# Patient Record
Sex: Male | Born: 1990 | Race: Black or African American | Hispanic: No | Marital: Single | Smoking: Former smoker
Health system: Southern US, Community
[De-identification: ages and names within clinical notes are randomized; demographics above are authoritative.]

## PROBLEM LIST (undated history)

## (undated) DIAGNOSIS — B009 Herpesviral infection, unspecified: Secondary | ICD-10-CM

## (undated) DIAGNOSIS — D1803 Hemangioma of intra-abdominal structures: Secondary | ICD-10-CM

## (undated) DIAGNOSIS — K2970 Gastritis, unspecified, without bleeding: Secondary | ICD-10-CM

## (undated) HISTORY — PX: NO PAST SURGERIES: SHX2092

---

## 2012-02-24 ENCOUNTER — Encounter (HOSPITAL_COMMUNITY): Payer: Self-pay | Admitting: Emergency Medicine

## 2012-02-24 DIAGNOSIS — Y929 Unspecified place or not applicable: Secondary | ICD-10-CM | POA: Insufficient documentation

## 2012-02-24 DIAGNOSIS — Y939 Activity, unspecified: Secondary | ICD-10-CM | POA: Insufficient documentation

## 2012-02-24 DIAGNOSIS — F17200 Nicotine dependence, unspecified, uncomplicated: Secondary | ICD-10-CM | POA: Insufficient documentation

## 2012-02-24 DIAGNOSIS — L738 Other specified follicular disorders: Secondary | ICD-10-CM | POA: Insufficient documentation

## 2012-02-24 NOTE — ED Notes (Signed)
PT. REPORTS INSECT BITE AT RIGHT LATERAL UPPER THIGH LAST Sunday WITH DRAINAGE .

## 2012-02-25 ENCOUNTER — Emergency Department (HOSPITAL_COMMUNITY)
Admission: EM | Admit: 2012-02-25 | Discharge: 2012-02-25 | Disposition: A | Discharge status: Home / Self Care | Payer: BC Managed Care – PPO | Attending: Emergency Medicine | Admitting: Emergency Medicine

## 2012-02-25 DIAGNOSIS — L731 Pseudofolliculitis barbae: Secondary | ICD-10-CM

## 2012-02-25 MED ORDER — MUPIROCIN 2 % EX OINT
TOPICAL_OINTMENT | Freq: Two times a day (BID) | CUTANEOUS | Status: DC
  Administered 2012-02-25: 02:00:00 via TOPICAL
  Filled 2012-02-25: qty 22

## 2012-02-25 NOTE — ED Provider Notes (Signed)
History     CSN: [id number]  Arrival date & time 02/24/12  2333   None     Chief Complaint  Patient presents with  . Insect Bite    (Consider location/radiation/quality/duration/timing/severity/associated sxs/prior treatment) HPI Comments: Patient noted an itchy are on lateral thigh 4 days ago now scratched and sore   The history is provided by the patient.    History reviewed. No pertinent past medical history.  History reviewed. No pertinent past surgical history.  No family history on file.  History  Substance Use Topics  . Smoking status: Current Every Day Smoker  . Smokeless tobacco: Not on file  . Alcohol Use: Yes      Review of Systems  Constitutional: Negative for fever.  Cardiovascular: Negative.   Gastrointestinal: Negative.   Genitourinary: Negative.   Musculoskeletal: Negative for joint swelling.  Skin: Negative for wound.  Neurological: Negative for weakness.    Allergies  Review of patient's allergies indicates no known allergies.  Home Medications  No current outpatient prescriptions on file.  BP 137/74  Pulse 67  Temp 98.2 F (36.8 C) (Oral)  Resp 14  SpO2 99%  Physical Exam  Constitutional: He appears well-developed.  Eyes: Pupils are equal, round, and reactive to light.  Cardiovascular: Normal rate.   Musculoskeletal: He exhibits tenderness. He exhibits no edema.       Several ingrown hairs lateral R thigh   Neurological: He is alert.  Skin: Skin is warm. There is erythema.       Small amount of erythema noted ? Due to scratching or inflammatory process    ED Course  Procedures (including critical care time)  Labs Reviewed - No data to display No results found.   1. Ingrown hair       MDM  Will treat with topical Bactroban         Arman Filter, NP 02/25/12 0151

## 2012-02-25 NOTE — ED Notes (Signed)
Patient presents with what he states are two bug bites to his right upper thigh.  States he has had some drainage but no drainage now.

## 2012-02-25 NOTE — ED Notes (Signed)
Ointment given for patient to take home and apply

## 2012-03-06 NOTE — ED Provider Notes (Signed)
Medical screening examination/treatment/procedure(s) were performed by non-physician practitioner and as supervising physician I was immediately available for consultation/collaboration.   Suzi Roots, MD 03/06/12 [phone]

## 2012-12-10 ENCOUNTER — Encounter (HOSPITAL_COMMUNITY): Payer: Self-pay | Admitting: Emergency Medicine

## 2012-12-10 ENCOUNTER — Emergency Department (INDEPENDENT_AMBULATORY_CARE_PROVIDER_SITE_OTHER)
Admission: EM | Admit: 2012-12-10 | Discharge: 2012-12-10 | Disposition: A | Discharge status: Home / Self Care | Payer: BC Managed Care – PPO | Source: Home / Self Care

## 2012-12-10 DIAGNOSIS — M542 Cervicalgia: Secondary | ICD-10-CM

## 2012-12-10 DIAGNOSIS — S139XXA Sprain of joints and ligaments of unspecified parts of neck, initial encounter: Secondary | ICD-10-CM

## 2012-12-10 DIAGNOSIS — S0093XA Contusion of unspecified part of head, initial encounter: Secondary | ICD-10-CM

## 2012-12-10 DIAGNOSIS — T148XXA Other injury of unspecified body region, initial encounter: Secondary | ICD-10-CM

## 2012-12-10 DIAGNOSIS — S161XXA Strain of muscle, fascia and tendon at neck level, initial encounter: Secondary | ICD-10-CM

## 2012-12-10 DIAGNOSIS — M549 Dorsalgia, unspecified: Secondary | ICD-10-CM

## 2012-12-10 DIAGNOSIS — S0003XA Contusion of scalp, initial encounter: Secondary | ICD-10-CM

## 2012-12-10 MED ORDER — CYCLOBENZAPRINE HCL 5 MG PO TABS: ORAL_TABLET | ORAL | Status: DC

## 2012-12-10 MED ORDER — TRAMADOL HCL 50 MG PO TABS
50.0000 mg | ORAL_TABLET | Freq: Four times a day (QID) | ORAL | Status: DC | PRN
Start: 2012-12-10 — End: 2014-01-21

## 2012-12-10 MED ORDER — DICLOFENAC POTASSIUM 50 MG PO TABS: 50.0000 mg | ORAL_TABLET | Freq: Three times a day (TID) | ORAL | Status: DC
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
## 2012-12-11 NOTE — ED Provider Notes (Signed)
Medical screening examination/treatment/procedure(s) were performed by a resident physician or non-physician practitioner and as the supervising physician I was immediately available for consultation/collaboration.  Clementeen Graham, MD   Rodolph Bong, MD 12/11/12 [phone]

## 2013-06-09 DIAGNOSIS — B009 Herpesviral infection, unspecified: Secondary | ICD-10-CM

## 2013-06-09 HISTORY — DX: Herpesviral infection, unspecified: B00.9

## 2013-06-23 ENCOUNTER — Emergency Department (HOSPITAL_COMMUNITY)
Admission: EM | Admit: 2013-06-23 | Discharge: 2013-06-23 | Disposition: A | Discharge status: Home / Self Care | Payer: BC Managed Care – PPO | Source: Home / Self Care | Attending: Emergency Medicine | Admitting: Emergency Medicine

## 2013-06-23 ENCOUNTER — Encounter (HOSPITAL_COMMUNITY): Payer: Self-pay | Admitting: Emergency Medicine

## 2013-06-23 ENCOUNTER — Other Ambulatory Visit (HOSPITAL_COMMUNITY)
Admission: RE | Admit: 2013-06-23 | Discharge: 2013-06-23 | Disposition: A | Discharge status: Home / Self Care | Payer: BC Managed Care – PPO | Source: Ambulatory Visit | Attending: Emergency Medicine | Admitting: Emergency Medicine

## 2013-06-23 DIAGNOSIS — Z113 Encounter for screening for infections with a predominantly sexual mode of transmission: Secondary | ICD-10-CM | POA: Insufficient documentation

## 2013-06-23 DIAGNOSIS — N342 Other urethritis: Secondary | ICD-10-CM

## 2013-06-23 LAB — POCT URINALYSIS DIP (DEVICE)
BILIRUBIN URINE: NEGATIVE
Glucose, UA: NEGATIVE mg/dL
KETONES UR: NEGATIVE mg/dL
LEUKOCYTES UA: NEGATIVE
Nitrite: NEGATIVE
PH: 7 (ref 5.0–8.0)
Protein, ur: 30 mg/dL — AB
SPECIFIC GRAVITY, URINE: 1.025 (ref 1.005–1.030)
Urobilinogen, UA: 1 mg/dL (ref 0.0–1.0)

## 2013-06-23 MED ORDER — AZITHROMYCIN 250 MG PO TABS
ORAL_TABLET | ORAL | Status: AC
  Filled 2013-06-23: qty 4

## 2013-06-23 MED ORDER — CEFTRIAXONE SODIUM 250 MG IJ SOLR
INTRAMUSCULAR | Status: AC
  Filled 2013-06-23: qty 250

## 2013-06-23 MED ORDER — CEFTRIAXONE SODIUM 250 MG IJ SOLR
250.0000 mg | Freq: Once | INTRAMUSCULAR | Status: AC
  Administered 2013-06-23: 250 mg via INTRAMUSCULAR

## 2013-06-23 MED ORDER — LIDOCAINE HCL (PF) 1 % IJ SOLN
INTRAMUSCULAR | Status: AC
  Filled 2013-06-23: qty 5

## 2013-06-23 MED ORDER — AZITHROMYCIN 250 MG PO TABS
1000.0000 mg | ORAL_TABLET | Freq: Once | ORAL | Status: AC
  Administered 2013-06-23: 1000 mg via ORAL

## 2013-06-23 NOTE — ED Notes (Signed)
C/o pain w urination since Sunday, Monday. Denies d/c ,denies unprotected sex. States he has not urinated since this AM  (only a few drops)

## 2013-06-23 NOTE — ED Provider Notes (Signed)
CSN: [id number]     Arrival date & time 06/23/13  1003 History   First MD Initiated Contact with Patient 06/23/13 1042     Chief Complaint  Patient presents with  . Dysuria   (Consider location/radiation/quality/duration/timing/severity/associated sxs/prior Treatment) HPI Comments: Denies genital lesions, hematuria or flank pain. Denies urgency, fever or penile discharge. 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.  Patient is a 23 y.o. male presenting with dysuria. The history is provided by the patient.  Dysuria This is a new problem. Episode onset: x 4 days. The problem has not changed since onset.   History reviewed. No pertinent past medical history. History reviewed. No pertinent past surgical history. History reviewed. No pertinent family history. History  Substance Use Topics  . Smoking status: Current Every Day Smoker  . Smokeless tobacco: Not on file  . Alcohol Use: Yes    Review of Systems  Genitourinary: Positive for dysuria.  All other systems reviewed and are negative.   Allergies  Review of patient's allergies indicates no known allergies.  Home Medications   Prior to Admission medications   Medication Sig Start Date End Date Taking? Authorizing Provider  cyclobenzaprine (FLEXERIL) 5 MG tablet Take 1 tablet before bedtime if needed for muscle pain. 12/10/12   Janne Napoleon, NP  diclofenac (CATAFLAM) 50 MG tablet Take 1 tablet (50 mg total) by mouth 3 (three) times daily. 12/10/12   Janne Napoleon, NP  traMADol (ULTRAM) 50 MG tablet Take 1 tablet (50 mg total) by mouth every 6 (six) hours as needed for pain. 12/10/12   Janne Napoleon, NP   BP 131/84  Pulse 77  Temp(Src) 98.4 F (36.9 C) (Oral)  Resp 10  SpO2 100% Physical Exam  Nursing note and vitals reviewed. Constitutional: He is oriented to person, place, and time. He appears well-developed and well-nourished.  HENT:  Head: Normocephalic and atraumatic.  Eyes: Conjunctivae are normal. No scleral icterus.   Cardiovascular: Normal rate, regular rhythm and normal heart sounds.   Pulmonary/Chest: Effort normal and breath sounds normal. No respiratory distress. He has no wheezes.  Abdominal: Soft. Bowel sounds are normal. He exhibits no distension. There is no tenderness. There is no CVA tenderness.  Genitourinary:  Patient declined examination of genitalia.   Musculoskeletal: Normal range of motion.  Neurological: He is alert and oriented to person, place, and time.  Skin: Skin is warm and dry. No rash noted.  Psychiatric: He has a normal mood and affect. His behavior is normal.    ED Course  Procedures (including critical care time) Labs Review Labs Reviewed  POCT URINALYSIS DIP (DEVICE) - Abnormal; Notable for the following:    Hgb urine dipstick SMALL (*)    Protein, ur 30 (*)    All other components within normal limits  URINE CYTOLOGY ANCILLARY ONLY    Results for orders placed during the hospital encounter of 06/23/13  POCT URINALYSIS DIP (DEVICE)      Result Value Ref Range   Glucose, UA NEGATIVE  NEGATIVE mg/dL   Bilirubin Urine NEGATIVE  NEGATIVE   Ketones, ur NEGATIVE  NEGATIVE mg/dL   Specific Gravity, Urine 1.025  1.005 - 1.030   Hgb urine dipstick SMALL (*) NEGATIVE   pH 7.0  5.0 - 8.0   Protein, ur 30 (*) NEGATIVE mg/dL   Urobilinogen, UA 1.0  0.0 - 1.0 mg/dL   Nitrite NEGATIVE  NEGATIVE   Leukocytes, UA NEGATIVE  NEGATIVE   Imaging Review No results found.  MDM   1. Urethritis    UA: only notable for trace heme. Discussed with patient the possibility of underlying STD causing his symptoms and that we could test for GC, chlamydia and trichomonas. Also offered him the option to be treated today for [facility] and chlamydia or to wait for lab results. He opted for empiric treatment.   [place], [place] 06/23/13 1125

## 2013-06-23 NOTE — Discharge Instructions (Signed)
Urethritis, Adult  Urethritis is an inflammation of the tube through which urine exits your bladder (urethra).   CAUSES  Urethritis is often caused by an infection in your urethra. The infection can be viral, like herpes. The infection can also be bacterial, like gonorrhea.  RISK FACTORS  Risk factors of urethritis include:  · Having sex without using a condom.  · Having multiple sexual partners.  · Having poor hygiene.  SIGNS AND SYMPTOMS  Symptoms of urethritis are less noticeable in women than in men. These symptoms include:  · Burning feeling when you urinate (dysuria).  · Discharge from your urethra.  · Blood in your urine (hematuria).  · Urinating more than usual.  DIAGNOSIS   To confirm a diagnosis of urethritis, your health care provider will do the following:  · Ask about your sexual history.  · Perform a physical exam.  · Have you provide a sample of your urine for lab testing.  · Use a cotton swab to gently collect a sample from your urethra for lab testing.  TREATMENT   It is important to treat urethritis. Depending on the cause, untreated urethritis may lead to serious genital infections and possibly infertility. Urethritis caused by a bacterial infection is treated with antibiotics. All sexual partners must be treated.   HOME CARE INSTRUCTIONS  · Do not have sex until the test results are known and treatment is completed, even if your symptoms go away before you finish treatment.  · Finish all medicines that you are prescribed.  SEEK MEDICAL CARE IF:   · Your symptoms are not improved in 3 days.  · Your symptoms are getting worse.  · You develop abdominal pain or pelvic pain (in women).  · You develop joint pain.  SEEK IMMEDIATE MEDICAL CARE IF:   · You have a fever with a temperature of 101.8°F (38.8°C) or greater.  · You have severe pain in the belly, back, or side.  · You have repeated vomiting.  Document Released: 08/21/2000 Document Revised: 12/16/2012 Document Reviewed: 10/26/2012  [employer]®  Patient Information ©2014 [employer].

## 2013-06-24 LAB — URINE CYTOLOGY ANCILLARY ONLY
CHLAMYDIA, DNA PROBE: NEGATIVE
Neisseria Gonorrhea: NEGATIVE
TRICH (WINDOWPATH): NEGATIVE

## 2013-06-24 NOTE — ED Provider Notes (Signed)
Medical screening examination/treatment/procedure(s) were performed by non-physician practitioner and as supervising physician I was immediately available for consultation/collaboration.  Philipp Deputy, M.D.  Harden Mo, MD 06/24/13 [phone]

## 2013-06-25 ENCOUNTER — Emergency Department (INDEPENDENT_AMBULATORY_CARE_PROVIDER_SITE_OTHER)
Admission: EM | Admit: 2013-06-25 | Discharge: 2013-06-25 | Disposition: A | Discharge status: Home / Self Care | Payer: BC Managed Care – PPO | Source: Home / Self Care | Attending: Family Medicine | Admitting: Family Medicine

## 2013-06-25 ENCOUNTER — Telehealth (HOSPITAL_COMMUNITY): Payer: Self-pay | Admitting: *Deleted

## 2013-06-25 ENCOUNTER — Encounter (HOSPITAL_COMMUNITY): Payer: Self-pay | Admitting: Emergency Medicine

## 2013-06-25 DIAGNOSIS — R300 Dysuria: Secondary | ICD-10-CM

## 2013-06-25 LAB — POCT URINALYSIS DIP (DEVICE)
Bilirubin Urine: NEGATIVE
Glucose, UA: 100 mg/dL — AB
Leukocytes, UA: NEGATIVE
NITRITE: POSITIVE — AB
PH: 6 (ref 5.0–8.0)
Protein, ur: 30 mg/dL — AB
Specific Gravity, Urine: 1.015 (ref 1.005–1.030)
UROBILINOGEN UA: 1 mg/dL (ref 0.0–1.0)

## 2013-06-25 LAB — RPR

## 2013-06-25 LAB — GLUCOSE, CAPILLARY: Glucose-Capillary: 94 mg/dL (ref 70–99)

## 2013-06-25 MED ORDER — DOXYCYCLINE HYCLATE 100 MG PO CAPS: 100.0000 mg | ORAL_CAPSULE | Freq: Two times a day (BID) | ORAL | Status: DC

## 2013-06-25 NOTE — ED Provider Notes (Signed)
Medical screening examination/treatment/procedure(s) were performed by a resident physician or non-physician practitioner and as the supervising physician I was immediately available for consultation/collaboration.  Evan Corey, MD    Evan S Corey, MD 06/25/13 2116 

## 2013-06-25 NOTE — Discharge Instructions (Signed)
You have had additional testing for herpes simplex virus 1 & 2 as well as HIV and syphilis. If any of these tests return with results that require treatment, you will be notified. Your urine will also be sent for culture once you are able to return with second specimen to try to determine the reason for your symptoms. I would also encourage you to contact the urologist listed on your paperwork (Dr. Gaynelle Arabian) for further evaluation if your symptoms persist.  Dysuria Dysuria is the medical term for pain with urination. There are many causes for dysuria, but urinary tract infection is the most common. If a urinalysis was performed it can show that there is a urinary tract infection. A urine culture confirms that you or your child is sick. You will need to follow up with a healthcare provider because:  If a urine culture was done you will need to know the culture results and treatment recommendations.  If the urine culture was positive, you or your child will need to be put on antibiotics or know if the antibiotics prescribed are the right antibiotics for your urinary tract infection.  If the urine culture is negative (no urinary tract infection), then other causes may need to be explored or antibiotics need to be stopped. Today laboratory work may have been done and there does not seem to be an infection. If cultures were done they will take at least 24 to 48 hours to be completed. Today x-rays may have been taken and they read as normal. No cause can be found for the problems. The x-rays may be re-read by a radiologist and you will be contacted if additional findings are made. You or your child may have been put on medications to help with this problem until you can see your primary caregiver. If the problems get better, see your primary caregiver if the problems return. If you were given antibiotics (medications which kill germs), take all of the mediations as directed for the full course of treatment.    If laboratory work was done, you need to find the results. Leave a telephone number where you can be reached. If this is not possible, make sure you find out how you are to get test results. HOME CARE INSTRUCTIONS   Drink lots of fluids. For adults, drink eight, 8 ounce glasses of clear juice or water a day. For children, replace fluids as suggested by your caregiver.  Empty the bladder often. Avoid holding urine for long periods of time.  After a bowel movement, women should cleanse front to back, using each tissue only once.  Empty your bladder before and after sexual intercourse.  Take all the medicine given to you until it is gone. You may feel better in a few days, but TAKE ALL MEDICINE.  Avoid caffeine, tea, alcohol and carbonated beverages, because they tend to irritate the bladder.  In men, alcohol may irritate the prostate.  Only take over-the-counter or prescription medicines for pain, discomfort, or fever as directed by your caregiver.  If your caregiver has given you a follow-up appointment, it is very important to keep that appointment. Not keeping the appointment could result in a chronic or permanent injury, pain, and disability. If there is any problem keeping the appointment, you must call back to this facility for assistance. SEEK IMMEDIATE MEDICAL CARE IF:   Back pain develops.  A fever develops.  There is nausea (feeling sick to your stomach) or vomiting (throwing up).  Problems are no  better with medications or are getting worse. MAKE SURE YOU:   Understand these instructions.  Will watch your condition.  Will get help right away if you are not doing well or get worse. Document Released: 11/24/2003 Document Revised: 05/20/2011 Document Reviewed: 10/01/2007 [facility] Patient Information 2014 [place].

## 2013-06-25 NOTE — ED Provider Notes (Signed)
CSN: [id number]     Arrival date & time 06/25/13  1551 History   First MD Initiated Contact with Patient 06/25/13 1609     No chief complaint on file.  (Consider location/radiation/quality/duration/timing/severity/associated sxs/prior Treatment) HPI Comments: Patient presents for re-evaluation. Was seen for dysuria 06-23-2013 and treated empirically for GC and chlamydia at time of visit. Urine cytology results reviewed from 06-23-2013 and were shown to be negative for GC, chlamydia and trichomonas. States he still has mild dysuria and since 06-23-2013, he has developed a single non-tender lesion on the shaft of his penis. Expresses concern regarding HSV. States he has had a similar lesion in the past (4 years ago) but states this was before he had become sexually active.   The history is provided by the patient.    History reviewed. No pertinent past medical history. History reviewed. No pertinent past surgical history. History reviewed. No pertinent family history. History  Substance Use Topics  . Smoking status: Current Every Day Smoker  . Smokeless tobacco: Not on file  . Alcohol Use: Yes    Review of Systems  Constitutional: Negative.   HENT: Negative.   Eyes: Negative.   Respiratory: Negative.   Cardiovascular: Negative.   Gastrointestinal: Negative.   Endocrine: Negative for polydipsia, polyphagia and polyuria.  Genitourinary: Positive for dysuria and genital sores. Negative for urgency, frequency, hematuria, flank pain, decreased urine volume, discharge, penile swelling, scrotal swelling, enuresis, difficulty urinating, penile pain and testicular pain.  Musculoskeletal: Negative.   Hematological: Negative for adenopathy.  Psychiatric/Behavioral: The patient is nervous/anxious.     Allergies  Review of patient's allergies indicates no known allergies.  Home Medications   Prior to Admission medications   Medication Sig Start Date End Date Taking? Authorizing Provider   cyclobenzaprine (FLEXERIL) 5 MG tablet Take 1 tablet before bedtime if needed for muscle pain. 12/10/12   Janne Napoleon, NP  diclofenac (CATAFLAM) 50 MG tablet Take 1 tablet (50 mg total) by mouth 3 (three) times daily. 12/10/12   Janne Napoleon, NP  traMADol (ULTRAM) 50 MG tablet Take 1 tablet (50 mg total) by mouth every 6 (six) hours as needed for pain. 12/10/12   Janne Napoleon, NP   BP 142/94  Pulse 80  Temp(Src) 98.4 F (36.9 C) (Oral)  Resp 16  SpO2 100% Physical Exam  Nursing note and vitals reviewed. Constitutional: He is oriented to person, place, and time. He appears well-developed and well-nourished. No distress.  HENT:  Head: Normocephalic and atraumatic.  Eyes: Conjunctivae are normal. No scleral icterus.  Cardiovascular: Normal rate.   Pulmonary/Chest: Effort normal.  Abdominal: Hernia confirmed negative in the right inguinal area and confirmed negative in the left inguinal area.  Genitourinary: Testes normal. Circumcised. No penile erythema or penile tenderness. No discharge found.  Single small 2-3 mm erythematous, non-tender ulcerated lesion on shaft of penis  Musculoskeletal: Normal range of motion.  Lymphadenopathy:       Right: No inguinal adenopathy present.       Left: No inguinal adenopathy present.  Neurological: He is alert and oriented to person, place, and time.  Skin: Skin is warm and dry.  Psychiatric: He has a normal mood and affect. His behavior is normal.    ED Course  Procedures (including critical care time) Labs Review Labs Reviewed  POCT URINALYSIS DIP (DEVICE) - Abnormal; Notable for the following:    Glucose, UA 100 (*)    Ketones, ur TRACE (*)    Hgb urine dipstick TRACE (*)  Protein, ur 30 (*)    Nitrite POSITIVE (*)    All other components within normal limits  HERPES SIMPLEX VIRUS CULTURE  URINE CULTURE  GLUCOSE, CAPILLARY  HSV 1 ANTIBODY, IGG  RPR  HIV ANTIBODY (ROUTINE TESTING)  HSV 2 ANTIBODY, IGG    Results for orders placed  during the hospital encounter of 06/25/13  GLUCOSE, CAPILLARY      Result Value Ref Range   Glucose-Capillary 94  70 - 99 mg/dL   Comment 1 Documented in Chart     Comment 2 Notify RN    POCT URINALYSIS DIP (DEVICE)      Result Value Ref Range   Glucose, UA 100 (*) NEGATIVE mg/dL   Bilirubin Urine NEGATIVE  NEGATIVE   Ketones, ur TRACE (*) NEGATIVE mg/dL   Specific Gravity, Urine 1.015  1.005 - 1.030   Hgb urine dipstick TRACE (*) NEGATIVE   pH 6.0  5.0 - 8.0   Protein, ur 30 (*) NEGATIVE mg/dL   Urobilinogen, UA 1.0  0.0 - 1.0 mg/dL   Nitrite POSITIVE (*) NEGATIVE   Leukocytes, UA NEGATIVE  NEGATIVE   Imaging Review No results found.   MDM   1. Dysuria    Viral culture swab sent of genital lesion. Will obtain blood testing for HSV 1 & 2, RPR and HIV testing.  UA: trace hgb and nitrite positive. Will send additional specimen (clean catch) for culture. Urine culture will be sent when patient returns with clean catch sample. Could not produce second specimen while at [facility].  Will treat with 7 day course of doxycycline and advise urology follow up if no improvement.   [place], [place] 06/25/13 [phone]

## 2013-06-25 NOTE — ED Notes (Signed)
Pt. called in for his lab results.  Pt. verified x 2 and given results- all neg. Hanley Seamen [facility] 06/25/2013

## 2013-06-25 NOTE — ED Notes (Signed)
Pt. called back and asked what the third test we checked and I said Trich. He asked if we checked for Herpes. I said no. I explained you have to have a sore or lesion that can get cultured. He asked how long to get the test back and I told him 3-4 days.  He said he has a bump on his penis and still has burning with urination. I told him he can come back and be rechecked. He asked how many people were waiting and I said none right now. Hanley Seamen [facility] 06/25/2013

## 2013-06-26 LAB — HIV ANTIBODY (ROUTINE TESTING W REFLEX): HIV 1&2 Ab, 4th Generation: NONREACTIVE

## 2013-06-28 LAB — URINE CULTURE
CULTURE: NO GROWTH
Colony Count: NO GROWTH

## 2013-06-28 LAB — HERPES SIMPLEX VIRUS CULTURE: CULTURE: DETECTED

## 2013-06-28 LAB — HSV 1 ANTIBODY, IGG: HSV 1 Glycoprotein G Ab, IgG: 0.41 IV

## 2013-06-28 LAB — HSV 2 ANTIBODY, IGG

## 2013-06-28 NOTE — ED Notes (Addendum)
HIV/RPR non-reactive, HSV 1 0.41 neg., HSV 2 <0.10 neg., Urine culture: No growth. 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  Lab shown to Dr. Juventino Slovak. He said no treatment now-to late.  I called pt. Pt. verified x 2 and given results.  Pt. instructed that it was to late to treat this outbreak and to notify his partner. Pt. instructed that he can pass the virus even when you doesn't have an outbreak, so always practice safe sex. Pt. instructed to get treated for each outbreak with Acyclovir or Valtrex and that he may want to get a PCP, who can give him a 1 yr  prescription for him to fill when he has an outbreak or they can give him suppressive therapy if he has frequent outbreaks.  Pt. voiced understanding. Hanley Seamen [facility] 06/28/2013

## 2014-01-09 DIAGNOSIS — D1803 Hemangioma of intra-abdominal structures: Secondary | ICD-10-CM

## 2014-01-09 HISTORY — DX: Hemangioma of intra-abdominal structures: D18.03

## 2014-01-18 ENCOUNTER — Encounter (HOSPITAL_COMMUNITY): Payer: Self-pay | Admitting: Emergency Medicine

## 2014-01-18 ENCOUNTER — Emergency Department (HOSPITAL_COMMUNITY)
Admission: EM | Admit: 2014-01-18 | Discharge: 2014-01-19 | Disposition: A | Discharge status: Home / Self Care | Payer: BC Managed Care – PPO | Attending: Emergency Medicine | Admitting: Emergency Medicine

## 2014-01-18 ENCOUNTER — Encounter (HOSPITAL_COMMUNITY): Payer: Self-pay | Admitting: *Deleted

## 2014-01-18 ENCOUNTER — Emergency Department (HOSPITAL_COMMUNITY)
Admission: EM | Admit: 2014-01-18 | Discharge: 2014-01-18 | Disposition: A | Discharge status: Nursing Home (Includes ICF) | Payer: BC Managed Care – PPO | Source: Home / Self Care | Attending: Family Medicine | Admitting: Family Medicine

## 2014-01-18 DIAGNOSIS — Z79899 Other long term (current) drug therapy: Secondary | ICD-10-CM | POA: Insufficient documentation

## 2014-01-18 DIAGNOSIS — R1011 Right upper quadrant pain: Secondary | ICD-10-CM | POA: Diagnosis not present

## 2014-01-18 DIAGNOSIS — R17 Unspecified jaundice: Secondary | ICD-10-CM | POA: Insufficient documentation

## 2014-01-18 DIAGNOSIS — Z720 Tobacco use: Secondary | ICD-10-CM | POA: Insufficient documentation

## 2014-01-18 DIAGNOSIS — Z8719 Personal history of other diseases of the digestive system: Secondary | ICD-10-CM | POA: Insufficient documentation

## 2014-01-18 DIAGNOSIS — Z791 Long term (current) use of non-steroidal anti-inflammatories (NSAID): Secondary | ICD-10-CM | POA: Diagnosis not present

## 2014-01-18 DIAGNOSIS — R1013 Epigastric pain: Secondary | ICD-10-CM

## 2014-01-18 DIAGNOSIS — R112 Nausea with vomiting, unspecified: Secondary | ICD-10-CM | POA: Diagnosis not present

## 2014-01-18 DIAGNOSIS — R1030 Lower abdominal pain, unspecified: Secondary | ICD-10-CM | POA: Diagnosis present

## 2014-01-18 HISTORY — DX: Gastritis, unspecified, without bleeding: K29.70

## 2014-01-18 LAB — COMPREHENSIVE METABOLIC PANEL
ALBUMIN: 4.1 g/dL (ref 3.5–5.2)
ALK PHOS: 50 U/L (ref 39–117)
ALT: 34 U/L (ref 0–53)
ANION GAP: 15 (ref 5–15)
AST: 39 U/L — ABNORMAL HIGH (ref 0–37)
BUN: 9 mg/dL (ref 6–23)
CO2: 25 mEq/L (ref 19–32)
Calcium: 9.5 mg/dL (ref 8.4–10.5)
Chloride: 95 mEq/L — ABNORMAL LOW (ref 96–112)
Creatinine, Ser: 0.84 mg/dL (ref 0.50–1.35)
GFR calc Af Amer: >90 mL/min (ref >90)
GFR calc non Af Amer: >90 mL/min (ref >90)
Glucose, Bld: 90 mg/dL (ref 70–99)
Potassium: 3.5 mEq/L — ABNORMAL LOW (ref 3.7–5.3)
SODIUM: 135 meq/L — AB (ref 137–147)
Total Bilirubin: 4.3 mg/dL — ABNORMAL HIGH (ref 0.3–1.2)
Total Protein: 7.4 g/dL (ref 6.0–8.3)

## 2014-01-18 LAB — CBC WITH DIFFERENTIAL/PLATELET
BASOS ABS: 0.1 10*3/uL (ref 0.0–0.1)
Basophils Relative: 1 % (ref 0–1)
Eosinophils Absolute: 0.1 10*3/uL (ref 0.0–0.7)
Eosinophils Relative: 1 % (ref 0–5)
HCT: 44.6 % (ref 39.0–52.0)
Hemoglobin: 15.8 g/dL (ref 13.0–17.0)
LYMPHS PCT: 23 % (ref 12–46)
Lymphs Abs: 2.3 10*3/uL (ref 0.7–4.0)
MCH: 31.7 pg (ref 26.0–34.0)
MCHC: 35.4 g/dL (ref 30.0–36.0)
MCV: 89.6 fL (ref 78.0–100.0)
MONOS PCT: 13 % — AB (ref 3–12)
Monocytes Absolute: 1.3 10*3/uL — ABNORMAL HIGH (ref 0.1–1.0)
NEUTROS PCT: 62 % (ref 43–77)
Neutro Abs: 6 10*3/uL (ref 1.7–7.7)
PLATELETS: 214 10*3/uL (ref 150–400)
RBC: 4.98 MIL/uL (ref 4.22–5.81)
RDW: 12.5 % (ref 11.5–15.5)
WBC: 9.8 10*3/uL (ref 4.0–10.5)

## 2014-01-18 LAB — URINALYSIS, ROUTINE W REFLEX MICROSCOPIC
Bilirubin Urine: NEGATIVE
Glucose, UA: NEGATIVE mg/dL
HGB URINE DIPSTICK: NEGATIVE
Ketones, ur: 40 mg/dL — AB
LEUKOCYTES UA: NEGATIVE
Nitrite: NEGATIVE
Protein, ur: NEGATIVE mg/dL
Specific Gravity, Urine: 1.019 (ref 1.005–1.030)
Urobilinogen, UA: 1 mg/dL (ref 0.0–1.0)
pH: 7.5 (ref 5.0–8.0)

## 2014-01-18 LAB — LIPASE, BLOOD: Lipase: 13 U/L (ref 11–59)

## 2014-01-18 LAB — I-STAT CG4 LACTIC ACID, ED: Lactic Acid, Venous: 1.25 mmol/L (ref 0.5–2.2)

## 2014-01-18 LAB — I-STAT TROPONIN, ED: TROPONIN I, POC: 0.02 ng/mL (ref 0.00–0.08)

## 2014-01-18 MED ORDER — SODIUM CHLORIDE 0.9 % IV BOLUS (SEPSIS)
1000.0000 mL | Freq: Once | INTRAVENOUS | Status: AC
  Administered 2014-01-18: 1000 mL via INTRAVENOUS

## 2014-01-18 MED ORDER — GI COCKTAIL ~~LOC~~
30.0000 mL | Freq: Once | ORAL | Status: AC
  Administered 2014-01-18: 30 mL via ORAL
  Filled 2014-01-18: qty 30

## 2014-01-18 NOTE — ED Notes (Signed)
Pt is from [place] and states last nite he was in the ED in [place] for vomiting and abdominal pain.  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 Pt appears weak, reports chills, vomiting, palpitations, and dizziness.

## 2014-01-18 NOTE — Discharge Instructions (Signed)
As discussed, it is important that you follow up with our gastroenterologist for continued management of your condition.  If you develop any new, or concerning changes in your condition, please return to the emergency department immediately.

## 2014-01-18 NOTE — ED Provider Notes (Signed)
CSN: [id number]     Arrival date & time 01/18/14  48 History   First MD Initiated Contact with Patient 01/18/14 1836     Chief Complaint  Patient presents with  . Abdominal Pain  . Emesis  . Dizziness  . Palpitations     (Consider location/radiation/quality/duration/timing/severity/associated sxs/prior Treatment) HPI  Patient presents with concerns of ongoing nausea, vomiting, abdominal pain. Symptoms began 2 days ago, without clear precipitant. After symptoms were present for one day the patient went to another hospital, and another state. He was diagnosed with gastritis, and VI.evaluation was found to have aLiver lesion, consistent with hemangioma. Since yesterday the patient has had persistent nausea, now with fatigue, anorexia, with less vomiting. Abdominal pain is focally in the epigastric, nonradiating, sore, severe. No clear alleviating or exacerbating factors. No new fever, confusion, disorientation, jaundice.   Past Medical History  Diagnosis Date  . Gastritis    History reviewed. No pertinent past surgical history. No family history on file. History  Substance Use Topics  . Smoking status: Current Every Day Smoker  . Smokeless tobacco: Not on file  . Alcohol Use: Yes    Review of Systems  Constitutional:       Per HPI, otherwise negative  HENT:       Per HPI, otherwise negative  Respiratory:       Per HPI, otherwise negative  Cardiovascular:       Per HPI, otherwise negative  Gastrointestinal: Positive for nausea, vomiting and abdominal pain.  Endocrine:       Negative aside from HPI  Genitourinary:       Neg aside from HPI   Musculoskeletal:       Per HPI, otherwise negative  Skin: Negative.   Neurological: Negative for syncope.      Allergies  Review of patient's allergies indicates no known allergies.  Home Medications   Prior to Admission medications   Medication Sig Start Date End Date Taking? Authorizing Provider   acetaminophen-codeine (TYLENOL #3) 300-30 MG per tablet Take by mouth every 4 (four) hours as needed for moderate pain.   Yes Historical Provider, MD  omeprazole (PRILOSEC) 20 MG capsule Take 20 mg by mouth daily.   Yes Historical Provider, MD  ranitidine (ZANTAC) 150 MG tablet Take 150 mg by mouth 2 (two) times daily.   Yes Historical Provider, MD  cyclobenzaprine (FLEXERIL) 5 MG tablet Take 1 tablet before bedtime if needed for muscle pain. 12/10/12   Janne Napoleon, NP  diclofenac (CATAFLAM) 50 MG tablet Take 1 tablet (50 mg total) by mouth 3 (three) times daily. 12/10/12   Janne Napoleon, NP  doxycycline (VIBRAMYCIN) 100 MG capsule Take 1 capsule (100 mg total) by mouth 2 (two) times daily. 06/25/13   Audelia Hives Presson, PA  ondansetron (ZOFRAN) 4 MG tablet Take 4 mg by mouth every 8 (eight) hours as needed for nausea or vomiting.    Historical Provider, MD  traMADol (ULTRAM) 50 MG tablet Take 1 tablet (50 mg total) by mouth every 6 (six) hours as needed for pain. 12/10/12   Janne Napoleon, NP   BP 132/82 mmHg  Pulse 70  Temp(Src) 98.2 F (36.8 C) (Oral)  Resp 18  SpO2 100% Physical Exam  Constitutional: He is oriented to person, place, and time. He appears well-developed. No distress.  HENT:  Head: Normocephalic and atraumatic.  Eyes: Conjunctivae and EOM are normal.  Cardiovascular: Normal rate and regular rhythm.   Pulmonary/Chest: Effort normal. No stridor. No respiratory  distress.  Abdominal: He exhibits no distension. There is no hepatosplenomegaly. There is tenderness in the epigastric area. There is no rigidity and no guarding.  Musculoskeletal: He exhibits no edema.  Neurological: He is alert and oriented to person, place, and time.  Skin: Skin is warm and dry.  Psychiatric: He has a normal mood and affect.  Nursing note and vitals reviewed.   ED Course  Procedures (including critical care time) Labs Review Labs Reviewed  CBC WITH DIFFERENTIAL - Abnormal; Notable for the  following:    Monocytes Relative 13 (*)    Monocytes Absolute 1.3 (*)    All other components within normal limits  COMPREHENSIVE METABOLIC PANEL - Abnormal; Notable for the following:    Sodium 135 (*)    Potassium 3.5 (*)    Chloride 95 (*)    AST 39 (*)    Total Bilirubin 4.3 (*)    All other components within normal limits  LIPASE, BLOOD  URINALYSIS, ROUTINE W REFLEX MICROSCOPIC  I-STAT TROPOININ, ED  I-STAT CG4 LACTIC ACID, ED      11:40 PM  On repeat exam the patient is in no distress.  We discussed all findings, including the elevated bilirubin level. Patient will follow up with gastroenterology in the next days.  He will obtain his CT scan.  MDM   Final diagnoses:  Epigastric pain  Elevated bilirubin    Patient presents with epigastric pain, nausea, vomiting, decreased but still present over the past few days. Patient is awake and alert, neurologically intact, hemodynamically stable, with soft, non-peritoneal abdomen, and a negative Murphy's sign. Patient's report of a abnormality found in the liver, as well as his elevated bilirubin suggest hepatic effects, though the patient's labs are otherwise reassuring. With resolution of the symptoms, following fluid rehydration, and was otherwise reassuring labs, his discharged to follow up with Urology in the next days.    Carmin Muskrat, MD 01/18/14 [phone]

## 2014-01-18 NOTE — ED Notes (Signed)
Pt here today because he was seen in the ED in [place] yesterday and was diagnosed with a 79mm liver mass, pt said that he was also diagnosed with gastritis, pt was here today because he is still very fatigued, and has chills, pt says that he just does not fell well overall

## 2014-01-18 NOTE — ED Provider Notes (Signed)
Shaun Hays is a 23 y.o. male who presents to Urgent Care today for Abdominal pain and vomiting and fatigue. Patient developed these symptoms yesterday and was seen at an emergency room in [place]. He had no abdominal CT scan and ultrasound which reportedly showed a 8 cm probable hemangioma in his liver and no other significant abnormalities. He was treated with Zofran Tylenol 3 omeprazole and ranitidine which have not helped. He continues to vomit feel lightheaded and have palpitations. No chest pains or shortness of breath.   Past Medical History  Diagnosis Date  . Gastritis    No past surgical history on file. History  Substance Use Topics  . Smoking status: Current Every Day Smoker  . Smokeless tobacco: Not on file  . Alcohol Use: Yes   ROS as above Medications: No current facility-administered medications for this encounter.   Current Outpatient Prescriptions  Medication Sig Dispense Refill  . acetaminophen-codeine (TYLENOL #3) 300-30 MG per tablet Take by mouth every 4 (four) hours as needed for moderate pain.    Marland Kitchen omeprazole (PRILOSEC) 20 MG capsule Take 20 mg by mouth daily.    . ondansetron (ZOFRAN) 4 MG tablet Take 4 mg by mouth every 8 (eight) hours as needed for nausea or vomiting.    . ranitidine (ZANTAC) 150 MG tablet Take 150 mg by mouth 2 (two) times daily.    . cyclobenzaprine (FLEXERIL) 5 MG tablet Take 1 tablet before bedtime if needed for muscle pain. 10 tablet 0  . diclofenac (CATAFLAM) 50 MG tablet Take 1 tablet (50 mg total) by mouth 3 (three) times daily. 15 tablet 0  . doxycycline (VIBRAMYCIN) 100 MG capsule Take 1 capsule (100 mg total) by mouth 2 (two) times daily. 14 capsule 0  . traMADol (ULTRAM) 50 MG tablet Take 1 tablet (50 mg total) by mouth every 6 (six) hours as needed for pain. 15 tablet 0   No Known Allergies   Exam:  BP 157/105 mmHg  Pulse 62  Temp(Src) 99.1 F (37.3 C) (Oral)  SpO2 98% Gen: Well NAD HEENT: EOMI,  MMM Lungs: Normal  work of breathing. CTABL Heart: RRR no MRG Abd: NABS, Soft. Nondistended, Nontender no masses palpated Exts: Brisk capillary refill, warm and well perfused.   No results found for this or any previous visit (from the past 24 hour(s)). No results found.  Assessment and Plan: 23 y.o. male with probable worsening gastritis. Patient continues to do poorly despite adequate treatment. His mother is very anxious. The patient feels pretty miserable. They request to be transferred to the emergency department. I have agreed.  Please call his mother at  [phone] to avoid duplicating imaging tests.   Discussed warning signs or symptoms. Please see discharge instructions. Patient expresses understanding.     Gregor Hams, MD 01/18/14 [phone]

## 2014-01-19 ENCOUNTER — Encounter (HOSPITAL_COMMUNITY): Payer: Self-pay | Admitting: Emergency Medicine

## 2014-01-19 ENCOUNTER — Inpatient Hospital Stay (HOSPITAL_COMMUNITY)
Admission: EM | Admit: 2014-01-19 | Discharge: 2014-01-21 | DRG: 392 | Disposition: A | Discharge status: Home / Self Care | Payer: BC Managed Care – PPO | Attending: Internal Medicine | Admitting: Internal Medicine

## 2014-01-19 ENCOUNTER — Emergency Department (HOSPITAL_COMMUNITY): Payer: BC Managed Care – PPO

## 2014-01-19 DIAGNOSIS — F17210 Nicotine dependence, cigarettes, uncomplicated: Secondary | ICD-10-CM | POA: Diagnosis present

## 2014-01-19 DIAGNOSIS — E876 Hypokalemia: Secondary | ICD-10-CM | POA: Diagnosis present

## 2014-01-19 DIAGNOSIS — R1110 Vomiting, unspecified: Secondary | ICD-10-CM

## 2014-01-19 DIAGNOSIS — R112 Nausea with vomiting, unspecified: Secondary | ICD-10-CM | POA: Diagnosis present

## 2014-01-19 DIAGNOSIS — R1011 Right upper quadrant pain: Secondary | ICD-10-CM | POA: Diagnosis present

## 2014-01-19 DIAGNOSIS — R17 Unspecified jaundice: Secondary | ICD-10-CM | POA: Insufficient documentation

## 2014-01-19 HISTORY — DX: Hemangioma of intra-abdominal structures: D18.03

## 2014-01-19 HISTORY — DX: Herpesviral infection, unspecified: B00.9

## 2014-01-19 LAB — COMPREHENSIVE METABOLIC PANEL
ALBUMIN: 3.9 g/dL (ref 3.5–5.2)
ALT: 29 U/L (ref 0–53)
AST: 30 U/L (ref 0–37)
Alkaline Phosphatase: 48 U/L (ref 39–117)
Anion gap: 19 — ABNORMAL HIGH (ref 5–15)
BUN: 10 mg/dL (ref 6–23)
CALCIUM: 8.9 mg/dL (ref 8.4–10.5)
CO2: 22 mEq/L (ref 19–32)
Chloride: 96 mEq/L (ref 96–112)
Creatinine, Ser: 0.84 mg/dL (ref 0.50–1.35)
GFR calc Af Amer: >90 mL/min (ref >90)
GFR calc non Af Amer: >90 mL/min (ref >90)
Glucose, Bld: 71 mg/dL (ref 70–99)
POTASSIUM: 3.2 meq/L — AB (ref 3.7–5.3)
Sodium: 137 mEq/L (ref 137–147)
TOTAL PROTEIN: 7.1 g/dL (ref 6.0–8.3)
Total Bilirubin: 4.8 mg/dL — ABNORMAL HIGH (ref 0.3–1.2)

## 2014-01-19 LAB — RAPID URINE DRUG SCREEN, HOSP PERFORMED
AMPHETAMINES: NOT DETECTED
BENZODIAZEPINES: NOT DETECTED
Barbiturates: NOT DETECTED
Cocaine: NOT DETECTED
OPIATES: POSITIVE — AB
Tetrahydrocannabinol: POSITIVE — AB

## 2014-01-19 LAB — URINALYSIS, ROUTINE W REFLEX MICROSCOPIC
Bilirubin Urine: NEGATIVE
GLUCOSE, UA: NEGATIVE mg/dL
Hgb urine dipstick: NEGATIVE
Ketones, ur: 40 mg/dL — AB
Leukocytes, UA: NEGATIVE
Nitrite: NEGATIVE
PH: 7 (ref 5.0–8.0)
PROTEIN: NEGATIVE mg/dL
Specific Gravity, Urine: 1.006 (ref 1.005–1.030)
Urobilinogen, UA: 0.2 mg/dL (ref 0.0–1.0)

## 2014-01-19 LAB — CBC WITH DIFFERENTIAL/PLATELET
BASOS PCT: 0 % (ref 0–1)
Basophils Absolute: 0 10*3/uL (ref 0.0–0.1)
Eosinophils Absolute: 0 10*3/uL (ref 0.0–0.7)
Eosinophils Relative: 0 % (ref 0–5)
HCT: 44 % (ref 39.0–52.0)
HEMOGLOBIN: 15.3 g/dL (ref 13.0–17.0)
LYMPHS PCT: 23 % (ref 12–46)
Lymphs Abs: 2.3 10*3/uL (ref 0.7–4.0)
MCH: 32 pg (ref 26.0–34.0)
MCHC: 34.8 g/dL (ref 30.0–36.0)
MCV: 92.1 fL (ref 78.0–100.0)
Monocytes Absolute: 0.9 10*3/uL (ref 0.1–1.0)
Monocytes Relative: 9 % (ref 3–12)
NEUTROS PCT: 68 % (ref 43–77)
Neutro Abs: 6.9 10*3/uL (ref 1.7–7.7)
PLATELETS: 210 10*3/uL (ref 150–400)
RBC: 4.78 MIL/uL (ref 4.22–5.81)
RDW: 12.4 % (ref 11.5–15.5)
WBC: 10.1 10*3/uL (ref 4.0–10.5)

## 2014-01-19 LAB — BILIRUBIN, FRACTIONATED(TOT/DIR/INDIR)
Bilirubin, Direct: 0.3 mg/dL (ref 0.0–0.3)
Indirect Bilirubin: 4.5 mg/dL — ABNORMAL HIGH (ref 0.3–0.9)
Total Bilirubin: 4.8 mg/dL — ABNORMAL HIGH (ref 0.3–1.2)

## 2014-01-19 LAB — PROTIME-INR
INR: 1.1 (ref 0.00–1.49)
PROTHROMBIN TIME: 14.3 s (ref 11.6–15.2)

## 2014-01-19 LAB — LIPASE, BLOOD: Lipase: 15 U/L (ref 11–59)

## 2014-01-19 LAB — TYPE AND SCREEN
ABO/RH(D): O POS
Antibody Screen: NEGATIVE

## 2014-01-19 LAB — IRON AND TIBC
Iron: 87 ug/dL (ref 42–135)
Saturation Ratios: 31 % (ref 20–55)
TIBC: 279 ug/dL (ref 215–435)
UIBC: 192 ug/dL (ref 125–400)

## 2014-01-19 LAB — HEPATITIS PANEL, ACUTE
HCV AB: NEGATIVE
HEP B C IGM: NONREACTIVE
HEP B S AG: NEGATIVE
Hep A IgM: NONREACTIVE

## 2014-01-19 LAB — FERRITIN: Ferritin: 315 ng/mL (ref 22–322)

## 2014-01-19 LAB — ABO/RH: ABO/RH(D): O POS

## 2014-01-19 LAB — ETHANOL

## 2014-01-19 LAB — HIV ANTIBODY (ROUTINE TESTING W REFLEX): HIV 1&2 Ab, 4th Generation: NONREACTIVE

## 2014-01-19 MED ORDER — MORPHINE SULFATE 4 MG/ML IJ SOLN
4.0000 mg | Freq: Once | INTRAMUSCULAR | Status: AC
  Administered 2014-01-19: 4 mg via INTRAVENOUS
  Filled 2014-01-19: qty 1

## 2014-01-19 MED ORDER — ONDANSETRON HCL 4 MG/2ML IJ SOLN
4.0000 mg | Freq: Once | INTRAMUSCULAR | Status: AC
  Administered 2014-01-19: 4 mg via INTRAVENOUS
  Filled 2014-01-19: qty 2

## 2014-01-19 MED ORDER — OXYCODONE HCL 5 MG PO TABS: 5.0000 mg | ORAL_TABLET | ORAL | Status: DC | PRN

## 2014-01-19 MED ORDER — ENOXAPARIN SODIUM 40 MG/0.4ML ~~LOC~~ SOLN
40.0000 mg | SUBCUTANEOUS | Status: DC
  Administered 2014-01-19 – 2014-01-20 (×2): 40 mg via SUBCUTANEOUS
  Filled 2014-01-19 (×2): qty 0.4

## 2014-01-19 MED ORDER — SODIUM CHLORIDE 0.9 % IV SOLN
INTRAVENOUS | Status: DC
  Administered 2014-01-19 – 2014-01-20 (×3): via INTRAVENOUS
  Administered 2014-01-21: 500 mL via INTRAVENOUS
  Administered 2014-01-21: 02:00:00 via INTRAVENOUS

## 2014-01-19 MED ORDER — POTASSIUM CHLORIDE CRYS ER 20 MEQ PO TBCR
40.0000 meq | EXTENDED_RELEASE_TABLET | Freq: Four times a day (QID) | ORAL | Status: AC
  Administered 2014-01-19 (×2): 40 meq via ORAL
  Filled 2014-01-19 (×2): qty 2

## 2014-01-19 MED ORDER — ONDANSETRON HCL 4 MG/2ML IJ SOLN: 4.0000 mg | Freq: Four times a day (QID) | INTRAMUSCULAR | Status: DC | PRN

## 2014-01-19 MED ORDER — MORPHINE SULFATE 2 MG/ML IJ SOLN
2.0000 mg | INTRAMUSCULAR | Status: DC | PRN
  Administered 2014-01-19 – 2014-01-20 (×3): 2 mg via INTRAVENOUS
  Filled 2014-01-19 (×3): qty 1

## 2014-01-19 MED ORDER — SODIUM CHLORIDE 0.9 % IV SOLN: INTRAVENOUS | Status: DC

## 2014-01-19 MED ORDER — SODIUM CHLORIDE 0.9 % IV BOLUS (SEPSIS)
1000.0000 mL | Freq: Once | INTRAVENOUS | Status: AC
  Administered 2014-01-19: 1000 mL via INTRAVENOUS

## 2014-01-19 MED ORDER — ONDANSETRON HCL 4 MG PO TABS
4.0000 mg | ORAL_TABLET | Freq: Four times a day (QID) | ORAL | Status: DC | PRN
Start: 2014-01-19 — End: 2014-01-21

## 2014-01-19 NOTE — ED Provider Notes (Signed)
CSN: [id number]     Arrival date & time 01/19/14  1142 History   First MD Initiated Contact with Patient 01/19/14 1149     Chief Complaint  Patient presents with  . Emesis     (Consider location/radiation/quality/duration/timing/severity/associated sxs/prior Treatment) HPI  [facility]  Patient to the ER with complaints of nausea, vomiting, abdominal pain that began on 11/9 while at home for his bday in [place]. He was seen at [facility] and was found to have a small hemangioma on CT scan. He was also noted to have elevated LFTs and white count. He was diagnosed with gastritis and sent home with Zantac and Zofran as well as pain medications. He came to [facility] ED yesterday for continued nausea,vomiting, anorexia and fatigue was found to have worsening gastritis -- they resolved in the ED and his labs were reassuring and he was supposed to follow-up with GI.   The mom has driven down from [place], she is concerned that he has lost too much weight, he continues to be nauseated and vomit- he retches in front of me, and mom describes flecks of blood in it. His mom also reports he know has jaundiced eyes. He continues to have severe pain. Mom feels that he needs to be admitted and is unable to wait until his GI appointment on Friday.  Past Medical History  Diagnosis Date  . Gastritis    History reviewed. No pertinent past surgical history. No family history on file. History  Substance Use Topics  . Smoking status: Current Every Day Smoker  . Smokeless tobacco: Not on file  . Alcohol Use: Yes    Review of Systems   Review of Systems  Gen: no weight loss, fevers, chills, night sweats  Eyes: no occular draining, occular pain,  No visual changes  + jaundice Nose: no epistaxis or rhinorrhea  Mouth: no dental pain, no sore throat  Neck: no neck pain  Lungs: No hemoptysis. No wheezing or coughing CV:  No palpitations, dependent edema or orthopnea. No chest  pain Abd: no diarrhea. + nausea or vomiting and abdominal pain  GU: no dysuria or gross hematuria  MSK:  No muscle weakness, No muscular pain Neuro: no headache, no focal neurologic deficits  Skin: no rash , no wounds Psyche: no complaints of depression or anxiety   Allergies  Review of patient's allergies indicates no known allergies.  Home Medications   Prior to Admission medications   Medication Sig Start Date End Date Taking? Authorizing Provider  acetaminophen-codeine (TYLENOL #3) 300-30 MG per tablet Take 1 tablet by mouth every 8 (eight) hours as needed for moderate pain.   Yes Historical Provider, MD  naproxen sodium (ANAPROX) 220 MG tablet Take 440 mg by mouth 2 (two) times daily with a meal.   Yes Historical Provider, MD  omeprazole (PRILOSEC) 20 MG capsule Take 20 mg by mouth daily.   Yes Historical Provider, MD  ondansetron (ZOFRAN) 4 MG tablet Take 4 mg by mouth every 8 (eight) hours as needed for nausea or vomiting.   Yes Historical Provider, MD  ranitidine (ZANTAC) 150 MG tablet Take 150 mg by mouth 2 (two) times daily.   Yes Historical Provider, MD  cyclobenzaprine (FLEXERIL) 5 MG tablet Take 1 tablet before bedtime if needed for muscle pain. Patient not taking: Reported on 01/19/2014 12/10/12   Janne Napoleon, NP  diclofenac (CATAFLAM) 50 MG tablet Take 1 tablet (50 mg total) by mouth 3 (three) times daily. Patient not taking: Reported  on 01/19/2014 12/10/12   Janne Napoleon, NP  doxycycline (VIBRAMYCIN) 100 MG capsule Take 1 capsule (100 mg total) by mouth 2 (two) times daily. Patient not taking: Reported on 01/19/2014 06/25/13   Lutricia Feil, PA  traMADol (ULTRAM) 50 MG tablet Take 1 tablet (50 mg total) by mouth every 6 (six) hours as needed for pain. Patient not taking: Reported on 01/19/2014 12/10/12   Janne Napoleon, NP   BP 137/83 mmHg  Pulse 60  Temp(Src) 98.3 F (36.8 C) (Oral)  Resp 11  SpO2 96% Physical Exam  Constitutional: He appears well-developed and  well-nourished. No distress.  HENT:  Head: Normocephalic and atraumatic.  Eyes: Pupils are equal, round, and reactive to light. Scleral icterus is present.  Neck: Normal range of motion. Neck supple.  Cardiovascular: Normal rate and regular rhythm.   Pulmonary/Chest: Effort normal.  Abdominal: Soft. There is tenderness in the right upper quadrant and epigastric area. There is no rigidity, no guarding, no CVA tenderness and negative Murphy's sign.  Pt retching on exam- no gross blood noted.  Neurological: He is alert.  Skin: Skin is warm and dry.  Nursing note and vitals reviewed.   ED Course  Procedures (including critical care time) Labs Review Labs Reviewed  COMPREHENSIVE METABOLIC PANEL - Abnormal; Notable for the following:    Potassium 3.2 (*)    Total Bilirubin 4.8 (*)    Anion gap 19 (*)    All other components within normal limits  URINALYSIS, ROUTINE W REFLEX MICROSCOPIC - Abnormal; Notable for the following:    Ketones, ur 40 (*)    All other components within normal limits  URINE RAPID DRUG SCREEN (HOSP PERFORMED) - Abnormal; Notable for the following:    Opiates POSITIVE (*)    Tetrahydrocannabinol POSITIVE (*)    All other components within normal limits  LIPASE, BLOOD  CBC WITH DIFFERENTIAL  ETHANOL  PROTIME-INR  HIV ANTIBODY (ROUTINE TESTING)  TYPE AND SCREEN  ABO/RH    Imaging Review US Abdomen Complete  01/19/2014   CLINICAL DATA:  Elevated bilirubin.  Initial encounter.  EXAM: ULTRASOUND ABDOMEN COMPLETE  COMPARISON:  None.  FINDINGS: Gallbladder: Biliary sludge. No gallbladder wall thickening, sonographic Murphy sign or pericholecystic fluid.  Common bile duct: Diameter: Normal, 2.4 mm.  Liver: No focal lesion identified. Within normal limits in parenchymal echogenicity.  IVC: No abnormality visualized.  Pancreas: Visualized portion unremarkable.  Spleen: Size and appearance within normal limits.  Right Kidney: Length: 11.2 cm. Echogenicity within normal  limits. No mass or hydronephrosis visualized.  Left Kidney: Length: 11.2 cm. Echogenicity within normal limits. No mass or hydronephrosis visualized.  Abdominal aorta: No aneurysm visualized.  Other findings: None.  IMPRESSION: Biliary sludge.  No acute abnormality.   Electronically Signed   By: Dereck Ligas M.D.   On: 01/19/2014 14:25     EKG Interpretation None      MDM   Final diagnoses:  Elevated bilirubin  Intractable vomiting with nausea, vomiting of unspecified type  RUQ pain    Medications  sodium chloride 0.9 % bolus 1,000 mL (not administered)  ondansetron (ZOFRAN) injection 4 mg (not administered)  sodium chloride 0.9 % bolus 1,000 mL (0 mLs Intravenous Stopped 01/19/14 1335)  ondansetron (ZOFRAN) injection 4 mg (4 mg Intravenous Given 01/19/14 1228)  morphine 4 MG/ML injection 4 mg (4 mg Intravenous Given 01/19/14 1228)    The patients [place] was normal before and now it shows sludge within the gallbladder. NO other gross findings.  LFTs have improved.  His Bilirubin has been trending up. Nn 11/9- 3, 11/10 - 4.3 and today 4.8 Pt unable to tolerate oral intake in the ED.  I spoke with on-call with [facility] since pt is schedule for appointment on Friday [place], [place]  with [facility] is aware he is here. She recommends I add on a fractionated bili. SHE WILL BE AVAILABLE FOR CONSULT if hospitalist feels that they need assistance.  3:28 pm [facility] hospitalist have agreed to admit, inpatient, [facility] admits, Dr. Coralyn Pear -- Mom, dad and patient have been updated on patient status.  Linus Mako, PA-C 01/19/14 1528  Linus Mako, PA-C 01/19/14 1537  Evelina Bucy, MD 01/20/14 [phone]

## 2014-01-19 NOTE — H&P (Signed)
[facility] History and Physical  Shaun Hays NID:[id number] DOB: 1990-09-03 DOA: 01/19/2014  Referring physician:  PCP: Pcp Not In System   Chief Complaint: Abdominal pain  HPI: Shaun Hays is a 23 y.o. male with no significantpast medical history presenting to the emergency department with complaints of abdominal pain associated with multiple episodes of nausea and vomiting. He states that symptoms started on 01/17/2014 while visiting his parents in [place]. He reports celebrating his birthday in [place], stating he went out for several drinks. On the following day he reported multiple episodes of nausea vomiting associated with right upper quadrant and epigastric pain. He attributed this to a hangover, however his symptoms persisted. He presented to the local emergency room where he had a CT scan that was unremarkable. At the time he was discharged on Zofran and Zantac. Despite this intervention he continued to do poorly, having minimal by mouth intake, with progression of abdominal pain. He presented to the emergency room at [facility] her labs revealed a total bilirubin of 4.3 while having alkaline phosphatase, lipase, AST and ALT within normal limits. Patient was discharged from the ER, to return today without improvement. Labs today showing upward trend his bilirubin to 4.8. Abdominal ultrasound showed presence of biliary sludge in gallbladder.                                                                                                                                                                                                     Review of Systems:  Constitutional:  No weight loss, night sweats, Fevers, positive for chills, fatigue.  HEENT:  No headaches, Difficulty swallowing,Tooth/dental problems,Sore throat,  No sneezing, itching, ear ache, nasal congestion, post nasal drip,  Cardio-vascular:  No chest pain, Orthopnea, PND, swelling in lower  extremities, anasarca, dizziness, palpitations  GI:  No heartburn, indigestion, positive forabdominal pain, nausea, vomiting, diarrhea, change in bowel habits, loss of appetite  Resp:  No shortness of breath with exertion or at rest. No excess mucus, no productive cough, No non-productive cough, No coughing up of blood.No change in color of mucus.No wheezing.No chest wall deformity  Skin:  no rash or lesions.  GU:  no dysuria, change in color of urine, no urgency or frequency. No flank pain.  Musculoskeletal:  No joint pain or swelling. No decreased range of motion. No back pain.  Psych:  No change in mood or affect. No depression or anxiety. No memory loss.   Past Medical History  Diagnosis Date  . Gastritis   . 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 No pertinent past surgical history. Social History:  reports that he has been smoking.  He does not have any smokeless tobacco history on file. He reports that he drinks alcohol. He reports that he does not use illicit drugs.  No Known Allergies  No family history on file.   Prior to Admission medications   Medication Sig Start Date End Date Taking? Authorizing Provider  acetaminophen-codeine (TYLENOL #3) 300-30 MG per tablet Take 1 tablet by mouth every 8 (eight) hours as needed for moderate pain.   Yes Historical Provider, MD  naproxen sodium (ANAPROX) 220 MG tablet Take 440 mg by mouth 2 (two) times daily with a meal.   Yes Historical Provider, MD  omeprazole (PRILOSEC) 20 MG capsule Take 20 mg by mouth daily.   Yes Historical Provider, MD  ondansetron (ZOFRAN) 4 MG tablet Take 4 mg by mouth every 8 (eight) hours as needed for nausea or vomiting.   Yes Historical Provider, MD  ranitidine (ZANTAC) 150 MG tablet Take 150 mg by mouth 2 (two) times daily.   Yes Historical Provider, MD  cyclobenzaprine (FLEXERIL) 5 MG tablet Take 1 tablet before bedtime if needed for muscle pain. Patient not taking: Reported on  01/19/2014 12/10/12   Janne Napoleon, NP  diclofenac (CATAFLAM) 50 MG tablet Take 1 tablet (50 mg total) by mouth 3 (three) times daily. Patient not taking: Reported on 01/19/2014 12/10/12   Janne Napoleon, NP  doxycycline (VIBRAMYCIN) 100 MG capsule Take 1 capsule (100 mg total) by mouth 2 (two) times daily. Patient not taking: Reported on 01/19/2014 06/25/13   Lutricia Feil, PA  traMADol (ULTRAM) 50 MG tablet Take 1 tablet (50 mg total) by mouth every 6 (six) hours as needed for pain. Patient not taking: Reported on 01/19/2014 12/10/12   Janne Napoleon, NP   Physical Exam: Filed Vitals:   01/19/14 1523 01/19/14 1530 01/19/14 1545 01/19/14 1600  BP: 137/83 124/71 130/74 126/80  Pulse: 60 54 56 58  Temp:      TempSrc:      Resp: 11 14 16 20   SpO2: 96% 99% 100% 98%    Wt Readings from Last 3 Encounters:  No data found for Wt    General:  Appears calm and comfortable, no acute distress Eyes: PERRL, normal lids, irises & conjunctiva, I think there may be mild scleral icterus present ENT: grossly normal hearing, lips & tongue Neck: no LAD, masses or thyromegaly Cardiovascular: RRR, no m/r/g. No LE edema. Telemetry: SR, no arrhythmias  Respiratory: CTA bilaterally, no w/r/r. Normal respiratory effort. Abdomen: soft,there is mild tenderness to palpation over right upper quadrant, Rezulin abdominal exam is benign, has audible bowel sounds, no rebound tenderness or guarding is present Skin: no rash or induration seen on limited exam Musculoskeletal: grossly normal tone BUE/BLE Psychiatric: grossly normal mood and affect, speech fluent and appropriate Neurologic: grossly non-focal.          Labs on Admission:  Basic Metabolic Panel:  Recent Labs Lab 01/18/14 1820 01/19/14 1313  NA 135* 137  K 3.5* 3.2*  CL 95* 96  CO2 25 22  GLUCOSE 90 71  BUN 9 10  CREATININE 0.84 0.84  CALCIUM 9.5 8.9   Liver Function Tests:  Recent Labs Lab 01/18/14 1820 01/19/14 1313  AST 39* 30  ALT  34 29  ALKPHOS 50 48  BILITOT 4.3* 4.8*  4.8*  PROT 7.4 7.1  ALBUMIN 4.1 3.9    Recent Labs Lab 01/18/14 1820 01/19/14  1313  LIPASE 13 15   No results for input(s): AMMONIA in the last 168 hours. CBC:  Recent Labs Lab 01/18/14 1820 01/19/14 1313  WBC 9.8 10.1  NEUTROABS 6.0 6.9  HGB 15.8 15.3  HCT 44.6 44.0  MCV 89.6 92.1  PLT 214 210   Cardiac Enzymes: No results for input(s): CKTOTAL, CKMB, CKMBINDEX, TROPONINI in the last 168 hours.  BNP (last 3 results) No results for input(s): PROBNP in the last 8760 hours. CBG: No results for input(s): GLUCAP in the last 168 hours.  Radiological Exams on Admission: US Abdomen Complete  01/19/2014   CLINICAL DATA:  Elevated bilirubin.  Initial encounter.  EXAM: ULTRASOUND ABDOMEN COMPLETE  COMPARISON:  None.  FINDINGS: Gallbladder: Biliary sludge. No gallbladder wall thickening, sonographic Murphy sign or pericholecystic fluid.  Common bile duct: Diameter: Normal, 2.4 mm.  Liver: No focal lesion identified. Within normal limits in parenchymal echogenicity.  IVC: No abnormality visualized.  Pancreas: Visualized portion unremarkable.  Spleen: Size and appearance within normal limits.  Right Kidney: Length: 11.2 cm. Echogenicity within normal limits. No mass or hydronephrosis visualized.  Left Kidney: Length: 11.2 cm. Echogenicity within normal limits. No mass or hydronephrosis visualized.  Abdominal aorta: No aneurysm visualized.  Other findings: None.  IMPRESSION: Biliary sludge.  No acute abnormality.   Electronically Signed   By: Dereck Ligas M.D.   On: 01/19/2014 14:25    EKG: Independently reviewed.   Assessment/Plan Principal Problem:   Hyperbilirubinemia Active Problems:   Nausea with vomiting   Right upper quadrant pain   1. Abdominal pain. Patient presented with complaints of abdominal pain located in the epigastric and right upper quadrant regions since 01/17/2014. Abdominal ultrasound performed in the emergency  room revealed the presence of biliary sludge. Labs showing the presence of hyperbilirubinemia having total bilirubin of 4.8. Lipase level within normal limits at 15. Possibilities include acute hepatitis, alcoholic hepatitis, cholestatic disease,  autoimmune hepatitis. Will check a HIDA scan, hepatitis panel, antinuclear antibodies, alpha-1 antitrypsin, ceruloplasmin, iron panel and ferritin, anti-mitochondrial antibodies, and tylenol level. Provide supportive care. GI consulted, await further recommendations.   2. Hypokalemia. Labs showing potassium of 3.2, likely secondary to GI loss. Will treat with Kdur 40 meq PO, repeat labs in am.    Code Status:  Full Code DVT Prophylaxis: Lovenox Family Communication: Spoke with his mother and father who were present at bedside Disposition Plan: Will admit to the inpatient service, anticipate will require greater than 2 nights hospitalization  Time spent: 65 min  Kelvin Cellar [facility] Pager [phone]

## 2014-01-19 NOTE — ED Notes (Signed)
Pt tolerating iced water

## 2014-01-19 NOTE — Consult Note (Signed)
[facility] Consult: 4:55 PM 01/19/2014  LOS: 0 days    Referring Provider: Dr Coralyn Pear.  Primary Care Physician:  none Primary Gastroenterologist:  unassigned     Reason for Consultation:  Elevated total bilirubin and plethora of GI complaints.    HPI: Shaun Hays is a 23 y.o. male.  Hx HSV1 in 06/2013. Smoker of 1 to 2 small cigars daily. 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  His home is in [place].  Patient celebrated his birthday on Saturday and consumed a total of about 3 shots of combination of brandy and tequila.  On Sunday he felt chills, abdominal pain in the mid epigastrium. He was sweating. He was vomiting nonbloody material. Symptoms persisted so on Monday 11/9 he was seen at [facility] in [place]. At that point his total bilirubin was 3.0 and his AST was 46. His lipase was 11, his white count was elevated at 20.1.  A CT scan showed showed hemangioma.  Ultrasound showed gallbladder sludge. On neither test were their liver, biliary tree, pancreatic, gastric abnormalities. There was no evidence radiologically of cholecystitis.  Presumed to have gastritis.  Did not have EGD. Treated with Omeprazole, Zofran, Tylenol 3, Ranitidine. Did not have EGD. Patient does not use nonsteroidal anti-inflammatory medications.  Has come to Urgent Care in [place] 11/10 with ongoing symptoms and to ED today.  T bili on first visit was 4.3, 4.8 today.  Bili has yet to be fractionated. AST was 39 but now 30.  WBCs, urine normal.  Ultrasound repeated on 11/11 and shows biliary sludge, but nothing in liver or bile ducts.  A GI OV was arranged at the [facility] on 11/13. However now the plan is to admit him for inpatient management and evaluation. Since his visit on 11/10 he has not taken  any more of the Tylenol 3. He has not been consuming much in the way of liquids or solids as he vomits solids and just hadn't been taking in much in the way of liquids though he does tolerate Gatorade, water, and juice.  He says the pain is about a level VIII but it is mostly gone following morphine given here in the ED today.  Urine is orange/concentrated but not amber/tea colored.  No pruritus.   ETOH intake is moderate, just 1 to 3 x monthly.  No unprotected sex.     Past Medical History  Diagnosis Date  . Gastritis   . 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 No pertinent past surgical history.  Prior to Admission medications   Medication Sig Start Date End Date Taking? Authorizing Provider  acetaminophen-codeine (TYLENOL #3) 300-30 MG per tablet Take 1 tablet by mouth every 8 (eight) hours as needed for moderate pain.   Yes Historical Provider, MD  naproxen sodium (ANAPROX) 220 MG tablet Take 440 mg by mouth 2 (two) times daily with a meal.   Yes Historical Provider,  MD  omeprazole (PRILOSEC) 20 MG capsule Take 20 mg by mouth daily.   Yes Historical Provider, MD  ondansetron (ZOFRAN) 4 MG tablet Take 4 mg by mouth every 8 (eight) hours as needed for nausea or vomiting.   Yes Historical Provider, MD  ranitidine (ZANTAC) 150 MG tablet Take 150 mg by mouth 2 (two) times daily.   Yes Historical Provider, MD                                Scheduled Meds: . enoxaparin (LOVENOX) injection  40 mg Subcutaneous Q24H  . potassium chloride  40 mEq Oral Q6H   Infusions: . sodium chloride 100 mL/hr at 01/19/14 1653   PRN Meds: morphine injection, ondansetron **OR** ondansetron (ZOFRAN) IV, oxyCODONE   Allergies as of 01/19/2014  . (No Known Allergies)    Family History:  Maternal GF with lung cancer  History   Social History  . Marital Status: Single    Spouse Name: N/A    Number of Children: N/A  .  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    Occupational History  . Student.    Social History Main Topics  . Smoking status: Current Every Day Smoker.  2 small cigars daily.   . Smokeless tobacco: Not on file  . Alcohol Use: Yes  . Drug Use: No  . Sexual Activity: Not on file     REVIEW OF SYSTEMS: Constitutional:  He has lost some weight. He feels weak but has not been presyncopal ENT:  No nose bleeds Pulm:  No shortness of breath or cough CV:  No palpitations, no LE edema.  GU:  No hematuria, no frequency GI:  Per history of present illness. Patient generally doesn't have stomach issues Heme:  No issues with unusual bleeding or bruising.   Transfusions:  None ever Neuro:  No headaches, no peripheral tingling or numbness Derm:  No itching, no rash or sores. Has multiple tattoos mostly on his arms and disease were first applied ~ 2009 and his last tattoo was in 2013. Endocrine:  No sweats or chills.  No polyuria or dysuria Immunization:  Not queried Travel:  None beyond local counties in last few months.    PHYSICAL EXAM: Vital signs in last 24 hours: Filed Vitals:   01/19/14 1643  BP: 142/84  Pulse: 54  Temp: 98.4 F (36.9 C)  Resp: 18   Wt Readings from Last 3 Encounters:  01/19/14 128 lb 1.6 oz (58.106 kg)    General: patient is a nonobese, somewhat thin but healthy appearing African-American male. His skin color makes it difficult to assess whether or not there is jaundice Head:  No asymmetry, signs of trauma or swelling  Eyes:  Slight scleral icterus Ears:  No hearing impairment  Nose:  No nasal congestion or discharge Mouth:  Clear, moist, dentition in good repair. Neck:  No mass, no TMG, no bruits Lungs:  Breathing quietly and unlabored. No cough. Chest sounds are clear Heart: regular rate rhythm, no murmurs rubs gallops. Abdomen:  Soft, thin, no masses, no hepatosplenomegaly. No distention and no tenderness.   Rectal:  deferred   Musc/Skeltl: no joint swelling, deformity or redness Extremities:  No cyanosis, clubbing or edema  Neurologic:  Patient is alert and oriented 3. Provides excellent history. No tremor, no limb weakness, no obvious deficits Skin:  There may be a slight yellow tinge to  his skin Tattoos:  On his arms and the right side of his shoulder/neck region Nodes:  No cervical or axillary adenopathy,   Psych:  Pleasant, relaxed, engaged, cooperative  Intake/Output from previous day:   Intake/Output this shift:    LAB RESULTS:  Recent Labs  01/18/14 1820 01/19/14 1313  WBC 9.8 10.1  HGB 15.8 15.3  HCT 44.6 44.0  PLT 214 210   BMET Lab Results  Component Value Date   NA 137 01/19/2014   NA 135* 01/18/2014   K 3.2* 01/19/2014   K 3.5* 01/18/2014   CL 96 01/19/2014   CL 95* 01/18/2014   CO2 22 01/19/2014   CO2 25 01/18/2014   GLUCOSE 71 01/19/2014   GLUCOSE 90 01/18/2014   BUN 10 01/19/2014   BUN 9 01/18/2014   CREATININE 0.84 01/19/2014   CREATININE 0.84 01/18/2014   CALCIUM 8.9 01/19/2014   CALCIUM 9.5 01/18/2014   LFT  Recent Labs  01/18/14 1820 01/19/14 1313  PROT 7.4 7.1  ALBUMIN 4.1 3.9  AST 39* 30  ALT 34 29  ALKPHOS 50 48  BILITOT 4.3* 4.8*  4.8*  BILIDIR  --  0.3  IBILI  --  4.5*   PT/INR Lab Results  Component Value Date   INR 1.10 01/19/2014   Hepatitis Panel No results for input(s): HEPBSAG, HCVAB, HEPAIGM, HEPBIGM in the last 72 hours. C-Diff No components found for: CDIFF Lipase     Component Value Date/Time   LIPASE 15 01/19/2014 1313    Drugs of Abuse     Component Value Date/Time   LABOPIA POSITIVE* 01/19/2014 1405   COCAINSCRNUR NONE DETECTED 01/19/2014 1405   LABBENZ NONE DETECTED 01/19/2014 1405   AMPHETMU NONE DETECTED 01/19/2014 1405   THCU POSITIVE* 01/19/2014 1405   LABBARB NONE DETECTED 01/19/2014 1405     RADIOLOGY STUDIES: US Abdomen Complete  01/19/2014   CLINICAL DATA:  Elevated bilirubin.  Initial  encounter.  EXAM: ULTRASOUND ABDOMEN COMPLETE  COMPARISON:  None.  FINDINGS: Gallbladder: Biliary sludge. No gallbladder wall thickening, sonographic Murphy sign or pericholecystic fluid.  Common bile duct: Diameter: Normal, 2.4 mm.  Liver: No focal lesion identified. Within normal limits in parenchymal echogenicity.  IVC: No abnormality visualized.  Pancreas: Visualized portion unremarkable.  Spleen: Size and appearance within normal limits.  Right Kidney: Length: 11.2 cm. Echogenicity within normal limits. No mass or hydronephrosis visualized.  Left Kidney: Length: 11.2 cm. Echogenicity within normal limits. No mass or hydronephrosis visualized.  Abdominal aorta: No aneurysm visualized.  Other findings: None.  IMPRESSION: Biliary sludge.  No acute abnormality.   Electronically Signed   By: Dereck Ligas M.D.   On: 01/19/2014 14:25    ENDOSCOPIC STUDIES:   IMPRESSION:   *  Protracted nausea and vomiting.  Elevated total bilirubin. AST was slightly elevated 11/10 but is now normal.  Sludge on GB ultrasound; however no evidence of cholecystitis.  Hemangioma on CT scan.   Question acute hepatitis, though one would expect to see transaminases be higher if this were the case. Rule out Gilbert's   *  tox screen + for opiates, THC. The former is explained by his recent Tylenol # 3 use.   *  Hypokalemia.    PLAN:     *  Dr. Coralyn Pear has ordered acute hepatitis panel, ANA, AMA, Alpha 1 AT, ceruloplasmin.  Will add haptoglobin to see if hemolyzing.  Fractionated bili ordered. HIDA scan ordered. Ok to have clears tonight as HIDA will not get done  until tomorrow.    Azucena Freed  01/19/2014, 4:55 PM Pager: [phone]    ________________________________________________________________________  [facility] MD note:  I personally examined the patient, reviewed the data and agree with the assessment and plan described above.  He has isolated indirect hyperbilirubinemia.  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  I cannot explain his abd pains, vomiting and plan to proceed with EGD tomorrow.  No risk factors for PUD however but he did see some red blood with vomiting yesterday.  He has atypical lymphocytes on smear (twice), perhaps mono.  I will order monospot test.    Owens Loffler, MD [facility] Pager [phone]

## 2014-01-19 NOTE — ED Notes (Signed)
To us

## 2014-01-19 NOTE — ED Notes (Signed)
Pt returned from [place] pt states he voided in [place] and "they threw it away" requested pt to try and give [place] a specimen now

## 2014-01-19 NOTE — ED Notes (Signed)
abd pain n/v  Since Sunday has lo0st weight seen yesterday   For same  Has been taking his meds but not helping

## 2014-01-20 ENCOUNTER — Inpatient Hospital Stay (HOSPITAL_COMMUNITY): Payer: BC Managed Care – PPO

## 2014-01-20 DIAGNOSIS — R112 Nausea with vomiting, unspecified: Principal | ICD-10-CM

## 2014-01-20 LAB — MITOCHONDRIAL ANTIBODIES: Mitochondrial M2 Ab, IgG: 0.38 (ref <0.91)

## 2014-01-20 LAB — CBC
HCT: 41.7 % (ref 39.0–52.0)
HEMOGLOBIN: 14.3 g/dL (ref 13.0–17.0)
MCH: 31.2 pg (ref 26.0–34.0)
MCHC: 34.3 g/dL (ref 30.0–36.0)
MCV: 91 fL (ref 78.0–100.0)
Platelets: 219 10*3/uL (ref 150–400)
RBC: 4.58 MIL/uL (ref 4.22–5.81)
RDW: 12.4 % (ref 11.5–15.5)
WBC: 8.6 10*3/uL (ref 4.0–10.5)

## 2014-01-20 LAB — COMPREHENSIVE METABOLIC PANEL
ALK PHOS: 40 U/L (ref 39–117)
ALT: 23 U/L (ref 0–53)
AST: 23 U/L (ref 0–37)
Albumin: 3.5 g/dL (ref 3.5–5.2)
Anion gap: 13 (ref 5–15)
BILIRUBIN TOTAL: 4.4 mg/dL — AB (ref 0.3–1.2)
BUN: 8 mg/dL (ref 6–23)
CHLORIDE: 100 meq/L (ref 96–112)
CO2: 23 mEq/L (ref 19–32)
Calcium: 8.7 mg/dL (ref 8.4–10.5)
Creatinine, Ser: 0.77 mg/dL (ref 0.50–1.35)
GFR calc non Af Amer: >90 mL/min (ref >90)
GLUCOSE: 73 mg/dL (ref 70–99)
POTASSIUM: 4 meq/L (ref 3.7–5.3)
SODIUM: 136 meq/L — AB (ref 137–147)
TOTAL PROTEIN: 6.1 g/dL (ref 6.0–8.3)

## 2014-01-20 LAB — HAPTOGLOBIN: Haptoglobin: 159 mg/dL (ref 45–215)

## 2014-01-20 LAB — ANA: ANA: NEGATIVE

## 2014-01-20 MED ORDER — TECHNETIUM TC 99M MEBROFENIN IV KIT
5.0000 | PACK | Freq: Once | INTRAVENOUS | Status: AC | PRN
  Administered 2014-01-20: 5 via INTRAVENOUS

## 2014-01-20 MED ORDER — SINCALIDE 5 MCG IJ SOLR
INTRAMUSCULAR | Status: AC
  Administered 2014-01-20: 1.2 ug via INTRAVENOUS
  Filled 2014-01-20: qty 5

## 2014-01-20 MED ORDER — SINCALIDE 5 MCG IJ SOLR
0.0200 ug/kg | Freq: Once | INTRAMUSCULAR | Status: AC
  Administered 2014-01-20: 1.2 ug via INTRAVENOUS

## 2014-01-20 MED ORDER — STERILE WATER FOR INJECTION IJ SOLN
INTRAMUSCULAR | Status: AC
  Filled 2014-01-20: qty 10

## 2014-01-20 NOTE — Progress Notes (Addendum)
PROGRESS NOTE  Shaun Hays NTI:[id number] DOB: 07-10-1990 DOA: 01/19/2014 PCP: Pcp Not In System  HPI/Subjective: Patient is a 23 year old male who was admitted to the hospital after multiple visits to the hospital and urgent care for abdominal pain with nausea and vomiting. He was found to have a bilirubin of 4.8 and abdominal ultrasound showed presence of biliary sludge in the gallbladder.     Assessment/Plan: Hyperbilirubinemia: HIDA scan unremarkable. 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  Pending alpha-1 antitrypsin, ceruloplasmin,  anti-mitochondrial antibodies, and tylenol level results. ANA- negative, Hepatitis panel acute non reactive and negative, Iron and TIBC within reference range, ferritin normal.  Patient to undergo EGD in in a.m.  Nausea and Vomiting: Resolved. Will start full liquid diet after HIDA scan and see how he tolerates it.   RUQ pain: See above. Has improved today and the patient is less tender to palpation.   Hypokalemia: Secondary to GI loss. Resolved to 4.0 on 11/12 after Kdur meq PO   DVT Prophylaxis:  Lovenox  Code Status: Full Family Communication: Spoke with family at bedside Disposition Plan: Remains inpatient- home when medically appropriate.    Consultants:  GI  Procedures:  HIDA scan on 11/12  Antibiotics:  None  Objective: Filed Vitals:   01/19/14 1643 01/19/14 1648 01/19/14 2119 01/20/14 0649  BP: 142/84  138/94 137/81  Pulse: 54  51 54  Temp: 98.4 F (36.9 C)  98.7 F (37.1 C) 98.2 F (36.8 C)  TempSrc: Oral  Oral   Resp: 18  17 16   Height:  5\' 4"  (1.626 m)    Weight:  58.106 kg (128 lb 1.6 oz)    SpO2: 99%  100% 99%    Intake/Output Summary (Last 24 hours) at 01/20/14 1035 Last data filed at 01/19/14 1806  Gross per 24 hour  Intake    360 ml  Output      0 ml  Net    360 ml   Filed Weights   01/19/14 1648  Weight: 58.106 kg  (128 lb 1.6 oz)    Exam: General: Well developed, well nourished, NAD, appears stated age  71:   EOMI, Anicteic Sclera, MMM.  Neck: Supple, no JVD, no masses  Cardiovascular: RRR, S1 S2 auscultated, no rubs, murmurs or gallops.   Respiratory: Clear to auscultation bilaterally with equal chest rise  Abdomen: Soft, slightly tender to deep palpation in RUQ, nondistended, + bowel sounds  Extremities: warm dry without cyanosis clubbing or edema.  Neuro: AAOx3, cranial nerves grossly intact. Strength 5/5 in upper and lower extremities  Skin: Without rashes exudates or nodules.   Psych: Normal affect and demeanor with intact judgement and insight   Data Reviewed: Basic Metabolic Panel:  Recent Labs Lab 01/18/14 1820 01/19/14 1313 01/20/14 0442  NA 135* 137 136*  K 3.5* 3.2* 4.0  CL 95* 96 100  CO2 25 22 23   GLUCOSE 90 71 73  BUN 9 10 8   CREATININE 0.84 0.84 0.77  CALCIUM 9.5 8.9 8.7   Liver Function Tests:  Recent Labs Lab 01/18/14 1820 01/19/14 1313 01/20/14 0442  AST 39* 30 23  ALT 34 29 23  ALKPHOS 50 48 40  BILITOT 4.3* 4.8*  4.8* 4.4*  PROT 7.4 7.1 6.1  ALBUMIN 4.1 3.9 3.5    Recent Labs Lab 01/18/14 1820 01/19/14 1313  LIPASE 13 15   CBC:  Recent Labs Lab 01/18/14 1820 01/19/14 1313 01/20/14 0442  WBC  9.8 10.1 8.6  NEUTROABS 6.0 6.9  --   HGB 15.8 15.3 14.3  HCT 44.6 44.0 41.7  MCV 89.6 92.1 91.0  PLT 214 210 219     Studies: US Abdomen Complete  01/19/2014   CLINICAL DATA:  Elevated bilirubin.  Initial encounter.  EXAM: ULTRASOUND ABDOMEN COMPLETE  COMPARISON:  None.  FINDINGS: Gallbladder: Biliary sludge. No gallbladder wall thickening, sonographic Murphy sign or pericholecystic fluid.  Common bile duct: Diameter: Normal, 2.4 mm.  Liver: No focal lesion identified. Within normal limits in parenchymal echogenicity.  IVC: No abnormality visualized.  Pancreas: Visualized portion unremarkable.  Spleen: Size and appearance within normal limits.   Right Kidney: Length: 11.2 cm. Echogenicity within normal limits. No mass or hydronephrosis visualized.  Left Kidney: Length: 11.2 cm. Echogenicity within normal limits. No mass or hydronephrosis visualized.  Abdominal aorta: No aneurysm visualized.  Other findings: None.  IMPRESSION: Biliary sludge.  No acute abnormality.   Electronically Signed   By: Dereck Ligas M.D.   On: 01/19/2014 14:25    Scheduled Meds: . enoxaparin (LOVENOX) injection  40 mg Subcutaneous Q24H   Continuous Infusions: . sodium chloride 100 mL/hr at 01/20/14 0302    Principal Problem:   Hyperbilirubinemia Active Problems:   Nausea with vomiting   Right upper quadrant pain    Shaun Ewing PA-S [facility] Pager [phone]. If 7PM-7AM, please contact night-coverage at [email or web address], password [facility] 01/20/2014, 10:35 AM  LOS: 1 day    Addendum  I personally evaluated patient on 01/20/2014 and agree with the above findings. Patient is a 23 year old gentleman with no significant past medical history admitted to the medicine service on 01/19/2014 presenting with complaints of abdominal pain associated with nausea vomiting since the weekend. This represented his third visit to the emergency room. He was worked up with ultrasound of abdomen which showed possible gallbladder sludge. Labs did show elevated total bilirubin. The rest of his lab work was unremarkable. There were no events overnight, this morning on my evaluation he reported feeling better with resolution to his nausea and vomiting and abdominal symptoms. Patient remains afebrile, nontoxic appearing, and asking for food. HIDA scan was unremarkable.fractionated bilirubin ordered by GI, found to have isolated indirect hyperbilirubinemia which could be secondary to Gilbert's. GI recommending he undergo EGD in a.m. Anticipate discharge in the next 24 hours if he remains stable.

## 2014-01-20 NOTE — Progress Notes (Signed)
Nutrition Brief Note  Patient identified on the Malnutrition Screening Tool (MST) Report  Wt Readings from Last 15 Encounters:  01/19/14 128 lb 1.6 oz (58.106 kg)    Body mass index is 21.98 kg/(m^2). Patient meets criteria for Normal weight based on current BMI. Pt states that his usual body weight is 123 lbs. He reports losing down to 117 lbs. Weight today using bed scale is 123 lbs. No evidence of muscle or fat wasting per physical exam.   Current diet order is Regular. Pt was on Full liquid diet for lunch and consumed about 75%. He reports that he is hungry again and ready for solid food; nausea and vomiting have resolved and his appetite is good. Labs and medications reviewed.   No nutrition interventions warranted at this time. If nutrition issues arise, please consult RD.   Pryor Ochoa RD, LDN Inpatient Clinical Dietitian Pager: [phone] After Hours Pager: [phone]

## 2014-01-21 ENCOUNTER — Encounter (HOSPITAL_COMMUNITY)
Admission: EM | Disposition: A | Discharge status: Home / Self Care | Payer: Self-pay | Source: Home / Self Care | Attending: Internal Medicine

## 2014-01-21 ENCOUNTER — Other Ambulatory Visit: Payer: Self-pay

## 2014-01-21 ENCOUNTER — Ambulatory Visit: Payer: BC Managed Care – PPO | Admitting: Physician Assistant

## 2014-01-21 ENCOUNTER — Encounter (HOSPITAL_COMMUNITY): Payer: Self-pay

## 2014-01-21 DIAGNOSIS — R1013 Epigastric pain: Secondary | ICD-10-CM

## 2014-01-21 HISTORY — PX: ESOPHAGOGASTRODUODENOSCOPY: SHX5428

## 2014-01-21 LAB — COMPREHENSIVE METABOLIC PANEL
ALT: 21 U/L (ref 0–53)
ANION GAP: 14 (ref 5–15)
AST: 21 U/L (ref 0–37)
Albumin: 3.6 g/dL (ref 3.5–5.2)
Alkaline Phosphatase: 42 U/L (ref 39–117)
BUN: 14 mg/dL (ref 6–23)
CALCIUM: 9 mg/dL (ref 8.4–10.5)
CO2: 24 meq/L (ref 19–32)
Chloride: 100 mEq/L (ref 96–112)
Creatinine, Ser: 0.87 mg/dL (ref 0.50–1.35)
GFR calc Af Amer: >90 mL/min (ref >90)
GLUCOSE: 76 mg/dL (ref 70–99)
Potassium: 3.8 mEq/L (ref 3.7–5.3)
Sodium: 138 mEq/L (ref 137–147)
Total Bilirubin: 2.3 mg/dL — ABNORMAL HIGH (ref 0.3–1.2)
Total Protein: 6.4 g/dL (ref 6.0–8.3)

## 2014-01-21 LAB — ALPHA-1-ANTITRYPSIN: A-1 Antitrypsin, Ser: 111 mg/dL (ref 83–199)

## 2014-01-21 LAB — MONONUCLEOSIS SCREEN: Mono Screen: NEGATIVE

## 2014-01-21 LAB — CERULOPLASMIN: Ceruloplasmin: 23 mg/dL (ref 18–36)

## 2014-01-21 SURGERY — EGD (ESOPHAGOGASTRODUODENOSCOPY)
Anesthesia: Moderate Sedation

## 2014-01-21 MED ORDER — FENTANYL CITRATE 0.05 MG/ML IJ SOLN
INTRAMUSCULAR | Status: DC | PRN
  Administered 2014-01-21 (×4): 25 ug via INTRAVENOUS

## 2014-01-21 MED ORDER — MIDAZOLAM HCL 10 MG/2ML IJ SOLN
INTRAMUSCULAR | Status: DC | PRN
  Administered 2014-01-21 (×5): 2 mg via INTRAVENOUS

## 2014-01-21 MED ORDER — BUTAMBEN-TETRACAINE-BENZOCAINE 2-2-14 % EX AERO
INHALATION_SPRAY | CUTANEOUS | Status: DC | PRN
  Administered 2014-01-21: 2 via TOPICAL

## 2014-01-21 MED ORDER — DIPHENHYDRAMINE HCL 50 MG/ML IJ SOLN
INTRAMUSCULAR | Status: AC
  Filled 2014-01-21: qty 1

## 2014-01-21 MED ORDER — MIDAZOLAM HCL 5 MG/ML IJ SOLN
INTRAMUSCULAR | Status: AC
  Filled 2014-01-21: qty 2

## 2014-01-21 MED ORDER — FENTANYL CITRATE 0.05 MG/ML IJ SOLN
INTRAMUSCULAR | Status: AC
  Filled 2014-01-21: qty 2

## 2014-01-21 NOTE — Interval H&P Note (Signed)
History and Physical Interval Note:  01/21/2014 11:18 AM  Shaun Hays  has presented today for surgery, with the diagnosis of hematemesis, abd pain  The various methods of treatment have been discussed with the patient and family. After consideration of risks, benefits and other options for treatment, the patient has consented to  Procedure(s): ESOPHAGOGASTRODUODENOSCOPY (EGD) (N/A) as a surgical intervention .  The patient's history has been reviewed, patient examined, no change in status, stable for surgery.  I have reviewed the patient's chart and labs.  Questions were answered to the patient's satisfaction.     Milus Banister

## 2014-01-21 NOTE — Progress Notes (Signed)
AVS discharge instructions were reviewed with patient and his parents. Patient stated that he did not have any questions. Patient ambulated to his transportation with his parents.

## 2014-01-21 NOTE — Discharge Summary (Signed)
Physician Discharge Summary  Shaun Hays ZTI:[id number] DOB: 02/09/1991 DOA: 01/19/2014  PCP: Pcp Not In System  Admit date: 01/19/2014 Discharge date: 01/21/2014  Time spent: 60 minutes  Recommendations for Outpatient Follow-up:  1. Please follow up on pending Ceruloplasmin, Alpha-1-antitrypsin test results. Recheck CMP.   Discharge Diagnoses:  Principal Problem:   Hyperbilirubinemia: HIDA scan unremarkable. 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  Pending alpha-1 antitrypsin, ceruloplasmin.  ANA- negative, Hepatitis panel acute non reactive and negative, Iron and TIBC within reference range, ferritin normal, Mono negative, haptoglobin within reference range, mitochondrial antibodies normal. Patient is feeling much better and expresses desire to go home.    Nausea with vomiting: Resolved. Patient tolerating foods.     Right upper quadrant pain: Resolved. Nontender to palpation. EGD to be performed.     Hypokalemia: Likely secondary to GI loss. Resolved on 11/12 after Kdur meq PO.    Discharge Condition: Stable  Diet recommendation: As Tolerated   Filed Weights   01/19/14 1648  Weight: 58.106 kg (128 lb 1.6 oz)    History of present illness:  Shaun Hays is a 23 y.o. male with no significantpast medical history who presented to the emergency department with complaints of abdominal pain associated with multiple episodes of nausea and vomiting. He stated that symptoms started on 01/17/2014 while visiting his parents in [place]. He reported celebrating his birthday in [place], stating he went out for several drinks. On the following day he reported multiple episodes of nausea vomiting associated with RUQ and epigastric pain. He attributed this to a hangover, however his symptoms persisted. He presented to the local emergency room where he had a CT scan that was unremarkable. At the time he was discharged on Zofran and  Zantac. Despite this intervention he continued to do poorly, having minimal by mouth intake, with progression of abdominal pain. He presented to the emergency room at [facility] where  labs revealed a total bilirubin of 4.3 while having alkaline phosphatase, lipase, AST and ALT within normal limits. Patient was discharged from the ER, to return on 11/11 without improvement. Labs showed upward trend of his bilirubin to 4.8. Abdominal ultrasound also showed presence of biliary sludge in gallbladder.  Hospital Course:  Patient was admitted to the hospital on 11/11. HIDA scan, hepatitis panel, antinuclear antibodies, alpha-1 antitrypsin, ceruloplasmin, iron panel and ferritin, anti-mitochondrial antibodies, and tylenol level, standing CMP were ordered. No laboratory or HIDA scan abnormalities besides total bilirubin. Sodium on 11/12 was 136 and potassium on 11/11 was 3.2. Attributed to GI loss and this resolved by 11/13. HIDA scan performed on 11/12 showed a gallstone EF of 85% with no sign of obstruction. Ceruloplasmin and alpha-1-antitrypsin still pending upon discharge.  His nausea, vomiting and abdominal pain resolved by 11/12. Pt underwent EGD on 11/13 which showed normal-appearing esophagus and GE junction, stomach was well visualized and normal appearance, normal-appearing duodenum.  Procedures:  EGD today on 11/13 ENDOSCOPIC IMPRESSION: Normal appearing esophagus and GE junction, the stomach was well visualized and normal in appearance, normal appearing duodenum  Consultations:  GI  Discharge Exam: Filed Vitals:   01/21/14 1306  BP: 135/93  Pulse: 44  Temp: 98 F (36.7 C)  Resp: 16    General: Patient ambulating around the hall, Well developed, well nourished, NAD, appears stated age  15: EOMI, Anicteic Sclera, MMM.  Neck: Supple, no masses  Cardiovascular: RRR, S1 S2 auscultated, no rubs, murmurs or gallops.  Respiratory: Clear to auscultation  bilaterally with equal chest rise  Abdomen: Soft, nontender, nondistended, + bowel sounds  Extremities: warm dry without cyanosis clubbing or edema.  Neuro: AAOx3, cranial nerves grossly intact. Strength 5/5 in upper and lower extremities  Skin: Without rashes exudates or nodules.  Psych: Normal affect and demeanor with intact judgement and insight  Discharge Instructions    Call MD for:  difficulty breathing, headache or visual disturbances    Complete by:  As directed      Call MD for:  extreme fatigue    Complete by:  As directed      Call MD for:  hives    Complete by:  As directed      Call MD for:  persistant dizziness or light-headedness    Complete by:  As directed      Call MD for:  persistant nausea and vomiting    Complete by:  As directed      Call MD for:  redness, tenderness, or signs of infection (pain, swelling, redness, odor or green/yellow discharge around incision site)    Complete by:  As directed      Call MD for:  severe uncontrolled pain    Complete by:  As directed      Call MD for:  temperature >100.4    Complete by:  As directed      Diet - low sodium heart healthy    Complete by:  As directed      Discharge instructions    Complete by:  As directed   Please follow up with your Primary Care Physician in 1-2 weeks     Increase activity slowly    Complete by:  As directed           Current Discharge Medication List    STOP taking these medications     acetaminophen-codeine (TYLENOL #3) 300-30 MG per tablet      naproxen sodium (ANAPROX) 220 MG tablet      omeprazole (PRILOSEC) 20 MG capsule      ondansetron (ZOFRAN) 4 MG tablet      ranitidine (ZANTAC) 150 MG tablet      cyclobenzaprine (FLEXERIL) 5 MG tablet      diclofenac (CATAFLAM) 50 MG tablet      doxycycline (VIBRAMYCIN) 100 MG capsule      traMADol (ULTRAM) 50 MG tablet        No Known Allergies Follow-up Information    Follow up with Pcp Not In System In 1 week.    Why:  To follow up on pending tests and follow Gilberts        The results of significant diagnostics from this hospitalization (including imaging, microbiology, ancillary and laboratory) are listed below for reference.    Significant Diagnostic Studies: Nm Hepatobiliary Including Gb  01/20/2014   CLINICAL DATA:  Right upper quadrant pain  EXAM: NUCLEAR MEDICINE HEPATOBILIARY IMAGING WITH GALLBLADDER EF  TECHNIQUE: Sequential images of the abdomen were obtained out to 60 minutes following intravenous administration of radiopharmaceutical. After slow intravenous infusion of 1.16 micrograms Cholecystokinin, gallbladder ejection fraction was determined.  RADIOPHARMACEUTICALS:  5.0 [employer] DJ-57S Choletec  COMPARISON:  Ultrasound 01/19/2014  FINDINGS: There is normal uptake of the tracer by the liver. Gallbladder and CBD visualized at 20 min. Post CCK gallbladder ejection fraction is 85%. At 30 min, normal ejection fraction is greater than 30%.  The patient did not experience symptoms during CCK infusion.  IMPRESSION: No cystic duct obstruction. Post CCK gallbladder ejection fraction is  85%. Normal gallbladder ejection fraction should be greater than 30%.   Electronically Signed   By: Lahoma Crocker M.D.   On: 01/20/2014 13:36   US Abdomen Complete  01/19/2014   CLINICAL DATA:  Elevated bilirubin.  Initial encounter.  EXAM: ULTRASOUND ABDOMEN COMPLETE  COMPARISON:  None.  FINDINGS: Gallbladder: Biliary sludge. No gallbladder wall thickening, sonographic Murphy sign or pericholecystic fluid.  Common bile duct: Diameter: Normal, 2.4 mm.  Liver: No focal lesion identified. Within normal limits in parenchymal echogenicity.  IVC: No abnormality visualized.  Pancreas: Visualized portion unremarkable.  Spleen: Size and appearance within normal limits.  Right Kidney: Length: 11.2 cm. Echogenicity within normal limits. No mass or hydronephrosis visualized.  Left Kidney: Length: 11.2 cm. Echogenicity within normal  limits. No mass or hydronephrosis visualized.  Abdominal aorta: No aneurysm visualized.  Other findings: None.  IMPRESSION: Biliary sludge.  No acute abnormality.   Electronically Signed   By: Dereck Ligas M.D.   On: 01/19/2014 14:25     Labs: Basic Metabolic Panel:  Recent Labs Lab 01/18/14 1820 01/19/14 1313 01/20/14 0442 01/21/14 0435  NA 135* 137 136* 138  K 3.5* 3.2* 4.0 3.8  CL 95* 96 100 100  CO2 25 22 23 24   GLUCOSE 90 71 73 76  BUN 9 10 8 14   CREATININE 0.84 0.84 0.77 0.87  CALCIUM 9.5 8.9 8.7 9.0   Liver Function Tests:  Recent Labs Lab 01/18/14 1820 01/19/14 1313 01/20/14 0442 01/21/14 0435  AST 39* 30 23 21   ALT 34 29 23 21   ALKPHOS 50 48 40 42  BILITOT 4.3* 4.8*  4.8* 4.4* 2.3*  PROT 7.4 7.1 6.1 6.4  ALBUMIN 4.1 3.9 3.5 3.6    Recent Labs Lab 01/18/14 1820 01/19/14 1313  LIPASE 13 15   CBC:  Recent Labs Lab 01/18/14 1820 01/19/14 1313 01/20/14 0442  WBC 9.8 10.1 8.6  NEUTROABS 6.0 6.9  --   HGB 15.8 15.3 14.3  HCT 44.6 44.0 41.7  MCV 89.6 92.1 91.0  PLT 214 210 219       Signed:  Letisha Yera PA-S [facility] 01/21/2014, 3:02 PM   Addendum I personally evaluated patient on 01/21/2014 and agree with the above findings. He has shown marked clinical improvement since admission. He had a benign abdominal exam tolerating PO without further episodes of nausea and vomiting on day of discharge with lab work remaining stable. During this hospitalization he was seen and evaluated by GI undergoing EGD prior to discharge. This study showed normal-appearing esophagus, GE junction, stomach, duodenum. It is possible that hyperbilirubinemia may have represented Gilberts. Workup otherwise has been unremarkable. Although abdominal ultrasound showed gallbladder sludge howeever HIDA scan was negative, hepatitis panel nonreactive, iron studies within normal limits, antinuclear antibodies negative. Patient was discharged in stable condition  to his home on 01/21/2014.

## 2014-01-21 NOTE — Op Note (Signed)
[facility] [place], [id number]   ENDOSCOPY PROCEDURE REPORT  PATIENT: Shaun Hays, Shaun Hays  MR#: [id number] BIRTHDATE: 1990-08-24 , 23  yrs. old GENDER: male ENDOSCOPIST: Milus Banister, MD REFERRED BY:  [facility] PROCEDURE DATE:  01/21/2014 PROCEDURE:  EGD, diagnostic ASA CLASS:     Class I INDICATIONS:  abdominal pain, minor hematemesis. MEDICATIONS: Fentanyl 100 mcg IV and Versed 10 mg IV TOPICAL ANESTHETIC: none  DESCRIPTION OF PROCEDURE: After the risks benefits and alternatives of the procedure were thoroughly explained, informed consent was obtained.  The Pentax Gastroscope [id number] endoscope was introduced through the mouth and advanced to the second portion of the duodenum , Without limitations.  The instrument was slowly withdrawn as the mucosa was fully examined.  EXAM: The esophagus and gastroesophageal junction were completely normal in appearance.  The stomach was entered and closely examined.The antrum, angularis, and lesser curvature were well visualized, including a retroflexed view of the cardia and fundus. The stomach wall was normally distensable.  The scope passed easily through the pylorus into the duodenum.  Retroflexed views revealed no abnormalities.     The scope was then withdrawn from the patient and the procedure completed.  COMPLICATIONS: There were no immediate complications.  ENDOSCOPIC IMPRESSION: Normal appearing esophagus and GE junction, the stomach was well visualized and normal in appearance, normal appearing duodenum  RECOMMENDATIONS: OK to discharge home today. My office will arrange for labs late next week (LFTs).  eSigned:  Milus Banister, MD 01/21/2014 12:21 PM

## 2014-01-21 NOTE — Care Management Note (Signed)
  Page 1 of 1   01/21/2014     3:54:50 PM CARE MANAGEMENT NOTE 01/21/2014  Patient:  Shaun Hays, Shaun Hays   Account Number:  [id number]  Date Initiated:  01/21/2014  Documentation initiated by:  Magdalen Spatz  Subjective/Objective Assessment:     Action/Plan:   Anticipated DC Date:  01/21/2014   Anticipated DC Plan:  HOME/SELF CARE         Choice offered to / List presented to:             Status of service:   Medicare Important Message given?   (If response is "NO", the following Medicare IM given date fields will be blank) Date Medicare IM given:   Medicare IM given by:   Date Additional Medicare IM given:   Additional Medicare IM given by:    Discharge Disposition:    Per UR Regulation:    If discussed at Long Length of Stay Meetings, dates discussed:    Comments:  01-21-14 Parents requesting list of PCP MD's . Explained they can call number on patient's BCBS card and be provided a list of MD's in network with patient's insurance. Parents voiced understanding. Magdalen Spatz RN BSN

## 2014-01-21 NOTE — H&P (View-Only) (Signed)
[facility] Consult: 4:55 PM 01/19/2014  LOS: 0 days    Referring Provider: Dr Coralyn Pear.  Primary Care Physician:  none Primary Gastroenterologist:  unassigned     Reason for Consultation:  Elevated total bilirubin and plethora of GI complaints.    HPI: Shaun Hays is a 23 y.o. male.  Hx HSV1 in 06/2013. Smoker of 1 to 2 small cigars daily. 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  His home is in [place].  Patient celebrated his birthday on Saturday and consumed a total of about 3 shots of combination of brandy and tequila.  On Sunday he felt chills, abdominal pain in the mid epigastrium. He was sweating. He was vomiting nonbloody material. Symptoms persisted so on Monday 11/9 he was seen at [facility] in [place]. At that point his total bilirubin was 3.0 and his AST was 46. His lipase was 11, his white count was elevated at 20.1.  A CT scan showed showed hemangioma.  Ultrasound showed gallbladder sludge. On neither test were their liver, biliary tree, pancreatic, gastric abnormalities. There was no evidence radiologically of cholecystitis.  Presumed to have gastritis.  Did not have EGD. Treated with Omeprazole, Zofran, Tylenol 3, Ranitidine. Did not have EGD. Patient does not use nonsteroidal anti-inflammatory medications.  Has come to Urgent Care in [place] 11/10 with ongoing symptoms and to ED today.  T bili on first visit was 4.3, 4.8 today.  Bili has yet to be fractionated. AST was 39 but now 30.  WBCs, urine normal.  Ultrasound repeated on 11/11 and shows biliary sludge, but nothing in liver or bile ducts.  A GI OV was arranged at the [facility] on 11/13. However now the plan is to admit him for inpatient management and evaluation. Since his visit on 11/10 he has not taken  any more of the Tylenol 3. He has not been consuming much in the way of liquids or solids as he vomits solids and just hadn't been taking in much in the way of liquids though he does tolerate Gatorade, water, and juice.  He says the pain is about a level VIII but it is mostly gone following morphine given here in the ED today.  Urine is orange/concentrated but not amber/tea colored.  No pruritus.   ETOH intake is moderate, just 1 to 3 x monthly.  No unprotected sex.     Past Medical History  Diagnosis Date  . Gastritis   . 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 No pertinent past surgical history.  Prior to Admission medications   Medication Sig Start Date End Date Taking? Authorizing Provider  acetaminophen-codeine (TYLENOL #3) 300-30 MG per tablet Take 1 tablet by mouth every 8 (eight) hours as needed for moderate pain.   Yes Historical Provider, MD  naproxen sodium (ANAPROX) 220 MG tablet Take 440 mg by mouth 2 (two) times daily with a meal.   Yes Historical Provider,  MD  omeprazole (PRILOSEC) 20 MG capsule Take 20 mg by mouth daily.   Yes Historical Provider, MD  ondansetron (ZOFRAN) 4 MG tablet Take 4 mg by mouth every 8 (eight) hours as needed for nausea or vomiting.   Yes Historical Provider, MD  ranitidine (ZANTAC) 150 MG tablet Take 150 mg by mouth 2 (two) times daily.   Yes Historical Provider, MD                                Scheduled Meds: . enoxaparin (LOVENOX) injection  40 mg Subcutaneous Q24H  . potassium chloride  40 mEq Oral Q6H   Infusions: . sodium chloride 100 mL/hr at 01/19/14 1653   PRN Meds: morphine injection, ondansetron **OR** ondansetron (ZOFRAN) IV, oxyCODONE   Allergies as of 01/19/2014  . (No Known Allergies)    Family History:  Maternal GF with lung cancer  History   Social History  . Marital Status: Single    Spouse Name: N/A    Number of Children: N/A  .  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    Occupational History  . Student.    Social History Main Topics  . Smoking status: Current Every Day Smoker.  2 small cigars daily.   . Smokeless tobacco: Not on file  . Alcohol Use: Yes  . Drug Use: No  . Sexual Activity: Not on file     REVIEW OF SYSTEMS: Constitutional:  He has lost some weight. He feels weak but has not been presyncopal ENT:  No nose bleeds Pulm:  No shortness of breath or cough CV:  No palpitations, no LE edema.  GU:  No hematuria, no frequency GI:  Per history of present illness. Patient generally doesn't have stomach issues Heme:  No issues with unusual bleeding or bruising.   Transfusions:  None ever Neuro:  No headaches, no peripheral tingling or numbness Derm:  No itching, no rash or sores. Has multiple tattoos mostly on his arms and disease were first applied ~ 2009 and his last tattoo was in 2013. Endocrine:  No sweats or chills.  No polyuria or dysuria Immunization:  Not queried Travel:  None beyond local counties in last few months.    PHYSICAL EXAM: Vital signs in last 24 hours: Filed Vitals:   01/19/14 1643  BP: 142/84  Pulse: 54  Temp: 98.4 F (36.9 C)  Resp: 18   Wt Readings from Last 3 Encounters:  01/19/14 128 lb 1.6 oz (58.106 kg)    General: patient is a nonobese, somewhat thin but healthy appearing African-American male. His skin color makes it difficult to assess whether or not there is jaundice Head:  No asymmetry, signs of trauma or swelling  Eyes:  Slight scleral icterus Ears:  No hearing impairment  Nose:  No nasal congestion or discharge Mouth:  Clear, moist, dentition in good repair. Neck:  No mass, no TMG, no bruits Lungs:  Breathing quietly and unlabored. No cough. Chest sounds are clear Heart: regular rate rhythm, no murmurs rubs gallops. Abdomen:  Soft, thin, no masses, no hepatosplenomegaly. No distention and no tenderness.   Rectal:  deferred   Musc/Skeltl: no joint swelling, deformity or redness Extremities:  No cyanosis, clubbing or edema  Neurologic:  Patient is alert and oriented 3. Provides excellent history. No tremor, no limb weakness, no obvious deficits Skin:  There may be a slight yellow tinge to  his skin Tattoos:  On his arms and the right side of his shoulder/neck region Nodes:  No cervical or axillary adenopathy,   Psych:  Pleasant, relaxed, engaged, cooperative  Intake/Output from previous day:   Intake/Output this shift:    LAB RESULTS:  Recent Labs  01/18/14 1820 01/19/14 1313  WBC 9.8 10.1  HGB 15.8 15.3  HCT 44.6 44.0  PLT 214 210   BMET Lab Results  Component Value Date   NA 137 01/19/2014   NA 135* 01/18/2014   K 3.2* 01/19/2014   K 3.5* 01/18/2014   CL 96 01/19/2014   CL 95* 01/18/2014   CO2 22 01/19/2014   CO2 25 01/18/2014   GLUCOSE 71 01/19/2014   GLUCOSE 90 01/18/2014   BUN 10 01/19/2014   BUN 9 01/18/2014   CREATININE 0.84 01/19/2014   CREATININE 0.84 01/18/2014   CALCIUM 8.9 01/19/2014   CALCIUM 9.5 01/18/2014   LFT  Recent Labs  01/18/14 1820 01/19/14 1313  PROT 7.4 7.1  ALBUMIN 4.1 3.9  AST 39* 30  ALT 34 29  ALKPHOS 50 48  BILITOT 4.3* 4.8*  4.8*  BILIDIR  --  0.3  IBILI  --  4.5*   PT/INR Lab Results  Component Value Date   INR 1.10 01/19/2014   Hepatitis Panel No results for input(s): HEPBSAG, HCVAB, HEPAIGM, HEPBIGM in the last 72 hours. C-Diff No components found for: CDIFF Lipase     Component Value Date/Time   LIPASE 15 01/19/2014 1313    Drugs of Abuse     Component Value Date/Time   LABOPIA POSITIVE* 01/19/2014 1405   COCAINSCRNUR NONE DETECTED 01/19/2014 1405   LABBENZ NONE DETECTED 01/19/2014 1405   AMPHETMU NONE DETECTED 01/19/2014 1405   THCU POSITIVE* 01/19/2014 1405   LABBARB NONE DETECTED 01/19/2014 1405     RADIOLOGY STUDIES: US Abdomen Complete  01/19/2014   CLINICAL DATA:  Elevated bilirubin.  Initial  encounter.  EXAM: ULTRASOUND ABDOMEN COMPLETE  COMPARISON:  None.  FINDINGS: Gallbladder: Biliary sludge. No gallbladder wall thickening, sonographic Murphy sign or pericholecystic fluid.  Common bile duct: Diameter: Normal, 2.4 mm.  Liver: No focal lesion identified. Within normal limits in parenchymal echogenicity.  IVC: No abnormality visualized.  Pancreas: Visualized portion unremarkable.  Spleen: Size and appearance within normal limits.  Right Kidney: Length: 11.2 cm. Echogenicity within normal limits. No mass or hydronephrosis visualized.  Left Kidney: Length: 11.2 cm. Echogenicity within normal limits. No mass or hydronephrosis visualized.  Abdominal aorta: No aneurysm visualized.  Other findings: None.  IMPRESSION: Biliary sludge.  No acute abnormality.   Electronically Signed   By: Shaun Hays M.D.   On: 01/19/2014 14:25    ENDOSCOPIC STUDIES:   IMPRESSION:   *  Protracted nausea and vomiting.  Elevated total bilirubin. AST was slightly elevated 11/10 but is now normal.  Sludge on GB ultrasound; however no evidence of cholecystitis.  Hemangioma on CT scan.   Question acute hepatitis, though one would expect to see transaminases be higher if this were the case. Rule out Gilbert's   *  tox screen + for opiates, THC. The former is explained by his recent Tylenol # 3 use.   *  Hypokalemia.    PLAN:     *  Dr. Coralyn Pear has ordered acute hepatitis panel, ANA, AMA, Alpha 1 AT, ceruloplasmin.  Will add haptoglobin to see if hemolyzing.  Fractionated bili ordered. HIDA scan ordered. Ok to have clears tonight as HIDA will not get done  until tomorrow.    Shaun Hays  01/19/2014, 4:55 PM Pager: [phone]    ________________________________________________________________________  [facility] MD note:  I personally examined the patient, reviewed the data and agree with the assessment and plan described above.  He has isolated indirect hyperbilirubinemia.  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  I cannot explain his abd pains, vomiting and plan to proceed with EGD tomorrow.  No risk factors for PUD however but he did see some red blood with vomiting yesterday.  He has atypical lymphocytes on smear (twice), perhaps mono.  I will order monospot test.    Shaun Loffler, MD [facility] Pager [phone]

## 2014-01-24 ENCOUNTER — Encounter (HOSPITAL_COMMUNITY): Payer: Self-pay | Admitting: Gastroenterology

## 2014-06-20 ENCOUNTER — Emergency Department (HOSPITAL_COMMUNITY): Payer: BLUE CROSS/BLUE SHIELD

## 2014-06-20 ENCOUNTER — Observation Stay (HOSPITAL_COMMUNITY)
Admission: EM | Admit: 2014-06-20 | Discharge: 2014-06-21 | Disposition: A | Discharge status: Home / Self Care | Payer: BLUE CROSS/BLUE SHIELD | Attending: Internal Medicine | Admitting: Internal Medicine

## 2014-06-20 ENCOUNTER — Encounter (HOSPITAL_COMMUNITY): Payer: Self-pay | Admitting: Emergency Medicine

## 2014-06-20 DIAGNOSIS — R112 Nausea with vomiting, unspecified: Secondary | ICD-10-CM | POA: Diagnosis present

## 2014-06-20 DIAGNOSIS — F17210 Nicotine dependence, cigarettes, uncomplicated: Secondary | ICD-10-CM | POA: Insufficient documentation

## 2014-06-20 DIAGNOSIS — R17 Unspecified jaundice: Secondary | ICD-10-CM | POA: Insufficient documentation

## 2014-06-20 DIAGNOSIS — K2970 Gastritis, unspecified, without bleeding: Secondary | ICD-10-CM | POA: Diagnosis not present

## 2014-06-20 DIAGNOSIS — N390 Urinary tract infection, site not specified: Secondary | ICD-10-CM

## 2014-06-20 DIAGNOSIS — R109 Unspecified abdominal pain: Secondary | ICD-10-CM

## 2014-06-20 DIAGNOSIS — Z888 Allergy status to other drugs, medicaments and biological substances status: Secondary | ICD-10-CM | POA: Diagnosis not present

## 2014-06-20 DIAGNOSIS — D1803 Hemangioma of intra-abdominal structures: Secondary | ICD-10-CM | POA: Diagnosis not present

## 2014-06-20 DIAGNOSIS — R1110 Vomiting, unspecified: Secondary | ICD-10-CM

## 2014-06-20 LAB — CBC WITH DIFFERENTIAL/PLATELET
Basophils Absolute: 0 10*3/uL (ref 0.0–0.1)
Basophils Relative: 0 % (ref 0–1)
Eosinophils Absolute: 0 10*3/uL (ref 0.0–0.7)
Eosinophils Relative: 0 % (ref 0–5)
HCT: 47.8 % (ref 39.0–52.0)
HEMOGLOBIN: 16.5 g/dL (ref 13.0–17.0)
LYMPHS PCT: 16 % (ref 12–46)
Lymphs Abs: 2.1 10*3/uL (ref 0.7–4.0)
MCH: 31.5 pg (ref 26.0–34.0)
MCHC: 34.5 g/dL (ref 30.0–36.0)
MCV: 91.2 fL (ref 78.0–100.0)
Monocytes Absolute: 1.7 10*3/uL — ABNORMAL HIGH (ref 0.1–1.0)
Monocytes Relative: 12 % (ref 3–12)
NEUTROS ABS: 9.8 10*3/uL — AB (ref 1.7–7.7)
Neutrophils Relative %: 72 % (ref 43–77)
Platelets: 256 10*3/uL (ref 150–400)
RBC: 5.24 MIL/uL (ref 4.22–5.81)
RDW: 13 % (ref 11.5–15.5)
WBC: 13.7 10*3/uL — ABNORMAL HIGH (ref 4.0–10.5)

## 2014-06-20 LAB — I-STAT CG4 LACTIC ACID, ED: Lactic Acid, Venous: 1.53 mmol/L (ref 0.5–2.0)

## 2014-06-20 LAB — URINE MICROSCOPIC-ADD ON

## 2014-06-20 LAB — URINALYSIS, ROUTINE W REFLEX MICROSCOPIC
GLUCOSE, UA: NEGATIVE mg/dL
Hgb urine dipstick: NEGATIVE
Nitrite: NEGATIVE
Protein, ur: 100 mg/dL — AB
Specific Gravity, Urine: 1.042 — ABNORMAL HIGH (ref 1.005–1.030)
Urobilinogen, UA: 1 mg/dL (ref 0.0–1.0)
pH: 6 (ref 5.0–8.0)

## 2014-06-20 LAB — LIPASE, BLOOD: Lipase: 89 U/L — ABNORMAL HIGH (ref 11–59)

## 2014-06-20 LAB — COMPREHENSIVE METABOLIC PANEL
ALBUMIN: 4.8 g/dL (ref 3.5–5.2)
ALK PHOS: 57 U/L (ref 39–117)
ALT: 25 U/L (ref 0–53)
ANION GAP: 15 (ref 5–15)
AST: 29 U/L (ref 0–37)
BILIRUBIN TOTAL: 5.4 mg/dL — AB (ref 0.3–1.2)
BUN: 17 mg/dL (ref 6–23)
CHLORIDE: 94 mmol/L — AB (ref 96–112)
CO2: 27 mmol/L (ref 19–32)
CREATININE: 0.99 mg/dL (ref 0.50–1.35)
Calcium: 10 mg/dL (ref 8.4–10.5)
GFR calc non Af Amer: >90 mL/min (ref >90)
Glucose, Bld: 112 mg/dL — ABNORMAL HIGH (ref 70–99)
Potassium: 3.1 mmol/L — ABNORMAL LOW (ref 3.5–5.1)
Sodium: 136 mmol/L (ref 135–145)
Total Protein: 8.5 g/dL — ABNORMAL HIGH (ref 6.0–8.3)

## 2014-06-20 LAB — MAGNESIUM: Magnesium: 2 mg/dL (ref 1.5–2.5)

## 2014-06-20 LAB — POC OCCULT BLOOD, ED: Fecal Occult Bld: NEGATIVE

## 2014-06-20 LAB — RAPID URINE DRUG SCREEN, HOSP PERFORMED
Amphetamines: NOT DETECTED
Barbiturates: NOT DETECTED
Benzodiazepines: NOT DETECTED
Cocaine: NOT DETECTED
OPIATES: NOT DETECTED
TETRAHYDROCANNABINOL: POSITIVE — AB

## 2014-06-20 LAB — ETHANOL: Alcohol, Ethyl (B): <5 mg/dL (ref 0–9)

## 2014-06-20 LAB — TROPONIN I

## 2014-06-20 MED ORDER — IOHEXOL 300 MG/ML  SOLN
25.0000 mL | Freq: Once | INTRAMUSCULAR | Status: AC | PRN
  Administered 2014-06-20: 25 mL via ORAL

## 2014-06-20 MED ORDER — SODIUM CHLORIDE 0.9 % IV BOLUS (SEPSIS)
1000.0000 mL | Freq: Once | INTRAVENOUS | Status: AC
  Administered 2014-06-20: 1000 mL via INTRAVENOUS

## 2014-06-20 MED ORDER — POTASSIUM CHLORIDE 10 MEQ/100ML IV SOLN
10.0000 meq | INTRAVENOUS | Status: AC
  Administered 2014-06-20 – 2014-06-21 (×2): 10 meq via INTRAVENOUS
  Filled 2014-06-20 (×2): qty 100

## 2014-06-20 MED ORDER — MORPHINE SULFATE 2 MG/ML IJ SOLN
2.0000 mg | Freq: Once | INTRAMUSCULAR | Status: AC
  Administered 2014-06-20: 2 mg via INTRAVENOUS
  Filled 2014-06-20 (×2): qty 1

## 2014-06-20 MED ORDER — SODIUM CHLORIDE 0.9 % IV BOLUS (SEPSIS)
1000.0000 mL | Freq: Once | INTRAVENOUS | Status: AC
  Administered 2014-06-21: 1000 mL via INTRAVENOUS

## 2014-06-20 MED ORDER — OXYCODONE-ACETAMINOPHEN 5-325 MG PO TABS
1.0000 | ORAL_TABLET | Freq: Once | ORAL | Status: AC
  Administered 2014-06-20: 1 via ORAL
  Filled 2014-06-20: qty 1

## 2014-06-20 MED ORDER — METOCLOPRAMIDE HCL 5 MG/ML IJ SOLN
5.0000 mg | Freq: Once | INTRAMUSCULAR | Status: AC
  Administered 2014-06-20: 5 mg via INTRAVENOUS
  Filled 2014-06-20: qty 2

## 2014-06-20 MED ORDER — IOHEXOL 300 MG/ML  SOLN
100.0000 mL | Freq: Once | INTRAMUSCULAR | Status: AC | PRN
  Administered 2014-06-20: 100 mL via INTRAVENOUS

## 2014-06-20 MED ORDER — ONDANSETRON 4 MG PO TBDP
8.0000 mg | ORAL_TABLET | Freq: Once | ORAL | Status: AC
  Administered 2014-06-20: 8 mg via ORAL
  Filled 2014-06-20: qty 2

## 2014-06-20 NOTE — ED Notes (Signed)
Pt reports nausea and vomiting since Friday. States he has a tumor on his liver that he was supposed to follow up about after it was discovered in November but he has not been able to do that. Also reports trouble having bowel movements despite taking a laxative. Reports minimal dark stool when he was able to have bm. Pt alert, oriented, nad.

## 2014-06-20 NOTE — ED Notes (Signed)
Pt sts N/V x 4 days with hx of same; pt sts some abd pain

## 2014-06-20 NOTE — ED Provider Notes (Signed)
CSN: [id number]     Arrival date & time 06/20/14  1241 History   First MD Initiated Contact with Patient 06/20/14 1534     Chief Complaint  Patient presents with  . Abdominal Pain  . Emesis     (Consider location/radiation/quality/duration/timing/severity/associated sxs/prior Treatment) The history is provided by the patient. No language interpreter was used.  Shaun Hays is a 24 year old male with past medical history of gastritis, HSV type I, hemangioma the liver presenting to the ED with abdominal pain, nausea, vomiting has been ongoing for the past 2 days. Patient reported that approximately 3 days ago, Friday evening, he ate cookout had a couple of drinks-reported having 3-4 drinks of tequila. Stated that when he woke up Saturday morning, 2 days ago, he felt okay, but then had sudden onset of nausea and vomiting. Reported that he was trying to drink water and Pedialyte without success-reported that he's been unable to keep any food or fluids down. Stated that Sunday had at least 2 episodes of emesis, and reported today that he had at least 4 episodes of emesis and notice small bright red blood specks in the emesis. Patient reported that his stomach is been feeling "weird," reported the most of his discomfort is localized to epigastric region described as an aching pain that is intermittent. Stated that he has not had a bowel movement since Thursday, 4 days ago. Reported that he had one small bowel movement today after taking a laxative and stated that it was black tarry stools. Reported that his last urination was yesterday, reported that he only urinated a small amount. Stated that he's been having intermittent chills and some dizziness. Reported that he has not been taking any NSAIDs chronically. States that he uses hot sauce every once in a while. Reported that he drinks at least 1 day per week, drinking approximately 2-3 drinks at a time in the of liquor. States that he smokes marijuana  every other day, approximately one joint. Reports he smokes a cigar a least one every day. Denied heroin and cocaine use. Stated that he was admitted back in November 2015 for intractable nausea and vomiting-reported that he had a CT scan at this time that identified a tumor of his liver, when asked if he is followed up with stomach doctors, patient reports that he has not had the time to do so. Denied diarrhea, hematochezia, chest pain, shortness of breath, difficulty breathing, leg swelling, fevers, neck pain, neck stiffness, fainting, blurred vision, sudden loss of vision, headaches, travels, sick contacts, blood thinners. PCP Dr. Caprice [facility] none  Past Medical History  Diagnosis Date  . Gastritis   . 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 Hemangioma of liver 01/2014   Past Surgical History  Procedure Laterality Date  . No past surgeries    . Esophagogastroduodenoscopy N/A 01/21/2014    Procedure: ESOPHAGOGASTRODUODENOSCOPY (EGD);  Surgeon: Milus Banister, MD;  Location: [facility];  Service: Endoscopy;  Laterality: N/A;   History reviewed. No pertinent family history. History  Substance Use Topics  . Smoking status: Former Smoker -- 5 years    Types: Cigars    Quit date: 01/14/2014  . Smokeless tobacco: Never Used  . Alcohol Use: Yes     Comment: 01/19/2014 "2-3 mixed drinks maybe 3 times/month"    Review of Systems  Constitutional: Positive for chills. Negative for fever.  Respiratory: Negative for chest tightness, shortness of breath and wheezing.  Cardiovascular: Negative for chest pain.  Gastrointestinal: Positive for nausea, vomiting, abdominal pain and constipation. Negative for diarrhea, blood in stool and anal bleeding.  Genitourinary: Positive for decreased urine volume. Negative for dysuria and hematuria.  Musculoskeletal: Positive for back pain. Negative for neck pain and neck stiffness.  Neurological: Positive for dizziness. Negative for  weakness.      Allergies  Diphenhydramine  Home Medications   Prior to Admission medications   Medication Sig Start Date End Date Taking? Authorizing Provider  Fructose-Dextrose-Phosphor Acd ([employer] NAUSEA RELIEF PO) Take 1 tablet by mouth daily as needed (nausea, abdominal pain).   Yes Historical Provider, MD  PEDIALYTE (PEDIALYTE) SOLN Take 120 mLs by mouth every 6 (six) hours as needed (upset stomach).   Yes Historical Provider, MD   BP 144/98 mmHg  Pulse 76  Temp(Src) 98.8 F (37.1 C) (Oral)  Resp 14  SpO2 100% Physical Exam  Constitutional: He is oriented to person, place, and time. He appears well-developed and well-nourished. No distress.  HENT:  Head: Normocephalic and atraumatic.  Mouth/Throat: Oropharynx is clear and moist. No oropharyngeal exudate.  Eyes: Conjunctivae and EOM are normal. Pupils are equal, round, and reactive to light. Right eye exhibits no discharge. Left eye exhibits no discharge.  Neck: Normal range of motion. Neck supple. No tracheal deviation present.  Cardiovascular: Normal rate, regular rhythm and normal heart sounds.  Exam reveals no friction rub.   No murmur heard. Pulmonary/Chest: Effort normal and breath sounds normal. No respiratory distress. He has no wheezes. He has no rales.  Abdominal: Soft. Normal appearance and bowel sounds are normal. He exhibits no distension. There is tenderness in the epigastric area and suprapubic area. There is no rigidity, no rebound, no guarding, no CVA tenderness, no tenderness at McBurney's point and negative Murphy's sign.  Genitourinary:  Rectal Exam: Negative swelling, erythema, inflammation, lesions, sores, deformities, hemorrhoids noted to the anus. Negative BRBPR. Strong sphincter tone. Brown stools noted on glove.  Exam chaperoned with tech, Venus  Musculoskeletal: Normal range of motion.  Lymphadenopathy:    He has no cervical adenopathy.  Neurological: He is alert and oriented to person, place, and  time. No cranial nerve deficit. He exhibits normal muscle tone. Coordination normal. GCS eye subscore is 4. GCS verbal subscore is 5. GCS motor subscore is 6.  Skin: Skin is warm and dry. No rash noted. He is not diaphoretic. No erythema.  Psychiatric: He has a normal mood and affect. His behavior is normal. Thought content normal.  Nursing note and vitals reviewed.   ED Course  Procedures (including critical care time)  Results for orders placed or performed during the hospital encounter of 06/20/14  CBC with Differential  Result Value Ref Range   WBC 13.7 (H) 4.0 - 10.5 K/uL   RBC 5.24 4.22 - 5.81 MIL/uL   Hemoglobin 16.5 13.0 - 17.0 g/dL   HCT 47.8 39.0 - 52.0 %   MCV 91.2 78.0 - 100.0 fL   MCH 31.5 26.0 - 34.0 pg   MCHC 34.5 30.0 - 36.0 g/dL   RDW 13.0 11.5 - 15.5 %   Platelets 256 150 - 400 K/uL   Neutrophils Relative % 72 43 - 77 %   Neutro Abs 9.8 (H) 1.7 - 7.7 K/uL   Lymphocytes Relative 16 12 - 46 %   Lymphs Abs 2.1 0.7 - 4.0 K/uL   Monocytes Relative 12 3 - 12 %   Monocytes Absolute 1.7 (H) 0.1 - 1.0 K/uL  Eosinophils Relative 0 0 - 5 %   Eosinophils Absolute 0.0 0.0 - 0.7 K/uL   Basophils Relative 0 0 - 1 %   Basophils Absolute 0.0 0.0 - 0.1 K/uL  Comprehensive metabolic panel  Result Value Ref Range   Sodium 136 135 - 145 mmol/L   Potassium 3.1 (L) 3.5 - 5.1 mmol/L   Chloride 94 (L) 96 - 112 mmol/L   CO2 27 19 - 32 mmol/L   Glucose, Bld 112 (H) 70 - 99 mg/dL   BUN 17 6 - 23 mg/dL   Creatinine, Ser 0.99 0.50 - 1.35 mg/dL   Calcium 10.0 8.4 - 10.5 mg/dL   Total Protein 8.5 (H) 6.0 - 8.3 g/dL   Albumin 4.8 3.5 - 5.2 g/dL   AST 29 0 - 37 U/L   ALT 25 0 - 53 U/L   Alkaline Phosphatase 57 39 - 117 U/L   Total Bilirubin 5.4 (H) 0.3 - 1.2 mg/dL   GFR calc non Af Amer >90 >90 mL/min   GFR calc Af Amer >90 >90 mL/min   Anion gap 15 5 - 15  Lipase, blood  Result Value Ref Range   Lipase 89 (H) 11 - 59 U/L  Urinalysis, Routine w reflex microscopic  Result Value  Ref Range   Color, Urine ORANGE (A) YELLOW   APPearance CLEAR CLEAR   Specific Gravity, Urine 1.042 (H) 1.005 - 1.030   pH 6.0 5.0 - 8.0   Glucose, UA NEGATIVE NEGATIVE mg/dL   Hgb urine dipstick NEGATIVE NEGATIVE   Bilirubin Urine SMALL (A) NEGATIVE   Ketones, ur >80 (A) NEGATIVE mg/dL   Protein, ur 100 (A) NEGATIVE mg/dL   Urobilinogen, UA 1.0 0.0 - 1.0 mg/dL   Nitrite NEGATIVE NEGATIVE   Leukocytes, UA TRACE (A) NEGATIVE  Urine rapid drug screen (hosp performed)  Result Value Ref Range   Opiates NONE DETECTED NONE DETECTED   Cocaine NONE DETECTED NONE DETECTED   Benzodiazepines NONE DETECTED NONE DETECTED   Amphetamines NONE DETECTED NONE DETECTED   Tetrahydrocannabinol POSITIVE (A) NONE DETECTED   Barbiturates NONE DETECTED NONE DETECTED  Troponin I  Result Value Ref Range   Troponin I <0.03 <0.031 ng/mL  Urine microscopic-add on  Result Value Ref Range   Squamous Epithelial / LPF RARE RARE   WBC, UA 0-2 <3 WBC/hpf   RBC / HPF 0-2 <3 RBC/hpf   Bacteria, UA RARE RARE   Urine-Other MUCOUS PRESENT   Ethanol  Result Value Ref Range   Alcohol, Ethyl (B) <5 0 - 9 mg/dL  Magnesium  Result Value Ref Range   Magnesium 2.0 1.5 - 2.5 mg/dL  Comprehensive metabolic panel  Result Value Ref Range   Sodium 137 135 - 145 mmol/L   Potassium 3.5 3.5 - 5.1 mmol/L   Chloride 99 96 - 112 mmol/L   CO2 29 19 - 32 mmol/L   Glucose, Bld 96 70 - 99 mg/dL   BUN 12 6 - 23 mg/dL   Creatinine, Ser 1.01 0.50 - 1.35 mg/dL   Calcium 8.6 8.4 - 10.5 mg/dL   Total Protein 7.0 6.0 - 8.3 g/dL   Albumin 3.9 3.5 - 5.2 g/dL   AST 24 0 - 37 U/L   ALT 20 0 - 53 U/L   Alkaline Phosphatase 44 39 - 117 U/L   Total Bilirubin 4.8 (H) 0.3 - 1.2 mg/dL   GFR calc non Af Amer >90 >90 mL/min   GFR calc Af Amer >90 >90 mL/min  Anion gap 9 5 - 15  I-Stat CG4 Lactic Acid, ED  Result Value Ref Range   Lactic Acid, Venous 1.53 0.5 - 2.0 mmol/L  POC occult blood, ED  Result Value Ref Range   Fecal Occult Bld  NEGATIVE NEGATIVE    Labs Review Labs Reviewed  CBC WITH DIFFERENTIAL/PLATELET - Abnormal; Notable for the following:    WBC 13.7 (*)    Neutro Abs 9.8 (*)    Monocytes Absolute 1.7 (*)    All other components within normal limits  COMPREHENSIVE METABOLIC PANEL - Abnormal; Notable for the following:    Potassium 3.1 (*)    Chloride 94 (*)    Glucose, Bld 112 (*)    Total Protein 8.5 (*)    Total Bilirubin 5.4 (*)    All other components within normal limits  LIPASE, BLOOD - Abnormal; Notable for the following:    Lipase 89 (*)    All other components within normal limits  URINALYSIS, ROUTINE W REFLEX MICROSCOPIC - Abnormal; Notable for the following:    Color, Urine ORANGE (*)    Specific Gravity, Urine 1.042 (*)    Bilirubin Urine SMALL (*)    Ketones, ur >80 (*)    Protein, ur 100 (*)    Leukocytes, UA TRACE (*)    All other components within normal limits  URINE RAPID DRUG SCREEN (HOSP PERFORMED) - Abnormal; Notable for the following:    Tetrahydrocannabinol POSITIVE (*)    All other components within normal limits  COMPREHENSIVE METABOLIC PANEL - Abnormal; Notable for the following:    Total Bilirubin 4.8 (*)    All other components within normal limits  URINE CULTURE  TROPONIN I  URINE MICROSCOPIC-ADD ON  ETHANOL  MAGNESIUM  I-STAT CG4 LACTIC ACID, ED  POC OCCULT BLOOD, ED    Imaging Review US Abdomen Complete  06/20/2014   CLINICAL DATA:  Abdominal pain and emesis for 3 days.  EXAM: ULTRASOUND ABDOMEN COMPLETE  COMPARISON:  01/19/2014  FINDINGS: Gallbladder: Physiologically distended. Minimal sludge in the gallbladder lumen. No gallstones or wall thickening visualized. No sonographic Murphy sign noted.  Common bile duct: Diameter: 4.6 mm.  Liver: No focal lesion identified. Within normal limits in parenchymal echogenicity.  IVC: No abnormality visualized.  Pancreas: Visualized portion unremarkable.  Spleen: Size and appearance within normal limits.  Right Kidney:  Length: 11.6 cm. Echogenicity within normal limits. No mass or hydronephrosis visualized.  Left Kidney: Length: 10.3 cm. Echogenicity within normal limits. No mass or hydronephrosis visualized.  Abdominal aorta: No aneurysm visualized.  Other findings: None.  No ascites.  IMPRESSION: 1. Minimal sludge in the gallbladder.  No findings of cholecystitis. 2. Otherwise normal abdominal ultrasound.   Electronically Signed   By: Jeb Levering M.D.   On: 06/20/2014 20:19   Ct Abdomen Pelvis W Contrast  06/20/2014   CLINICAL DATA:  Abdominal pain and emesis for 3 days.  Fever.  EXAM: CT ABDOMEN AND PELVIS WITH CONTRAST  TECHNIQUE: Multidetector CT imaging of the abdomen and pelvis was performed using the standard protocol following bolus administration of intravenous contrast.  CONTRAST:  68mL OMNIPAQUE IOHEXOL 300 MG/ML SOLN, 118mL OMNIPAQUE IOHEXOL 300 MG/ML SOLN  COMPARISON:  Abdominal ultrasound earlier this day.  FINDINGS: The included lung bases are clear.  The liver, gallbladder, spleen, pancreas, adrenal glands, and kidneys are normal. There is symmetric renal enhancement without hydronephrosis.  Stomach is physiologically distended. There are no dilated or thickened bowel loops. The appendix is filled with air  in contrast and is normal. Small volume of colonic stool, no colonic wall thickening. No free air, free fluid, or intra-abdominal fluid collection. Abdominal aorta is normal in caliber. No retroperitoneal adenopathy.  Within the pelvis the urinary bladder is physiologically distended. There is a moderate volume of intravesicular air, no bladder wall thickening or perivesicular inflammatory change. Prostate gland is normal in size. There is no pelvic free fluid. No pelvic adenopathy.  There are no acute or suspicious osseous abnormalities.  IMPRESSION: 1. Air in the urinary bladder. This may be related to recent catheterization. Cystitis with gas-forming organism is not excluded, however there is no  bladder wall thickening or perivesicular inflammatory change. 2. Otherwise no acute abnormality in the abdomen/pelvis.   Electronically Signed   By: Jeb Levering M.D.   On: 06/20/2014 21:16     EKG Interpretation   Date/Time:  Monday June 20 2014 16:19:41 EDT Ventricular Rate:  75 PR Interval:  123 QRS Duration: 91 QT Interval:  373 QTC Calculation: 417 R Axis:   73 Text Interpretation:  Sinus rhythm Probable anteroseptal infarct, old  Early repolarization No significant change since last tracing Confirmed by  Christy Gentles  MD, DONALD ([id number]) on 06/20/2014 4:25:47 PM       9:44 PM this provider discussed case with Dr. Ernestina Patches, [facility] hospitalist. Discussed case, labs, imaging in great detail. Dr. Ernestina Patches to come and assess patient.  9:48 PM Patient continues to be feels and started getting worse when he sits up.   9:55 PM Dr. Ernestina Patches saw and assessed patient, reports the patient does not need to be admitted to the hospital. Reported that patient has gastritis and does not need to be admitted at this time. As per Dr. Ernestina Patches, reported that patient will need 1 more liter of fluids and a recheck of his bilirubin afterwards. Reported that this is likely gastritis and patient can be discharged with PPIs.  10:37 PM Dr. Eulis Foster at bedside assessing patient. As per attending physician, Dr. Eulis Foster recommended for patient have a total of 4 L of fluids administered. After the fourth liter recommended CMP to be repeated. Recommended fluid challenge, patient can tolerate patient can go home. 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  12:49 AM Patient had episode of emesis in ED setting. Patient reported that his nausea is gone progressively worse, patient reported that he does not feel comfortable going home.  12:51 AM this provider spoke with Dr. Ernestina Patches, discussed that patient failed fluid  challenge. Dr. Ernestina Patches recommended GI cocktail and IV Protonix to be administered. Reported that if anything changes or worsens to call back and then patient will be admitted.  1:12 AM Dr. Eulis Foster at bedside.   1:33 AM Dr. Eulis Foster spoke with patient. Reported that patient is feeling a little bit better and would like to try to go home. Dr. Eulis Foster recommended patient to be discharged with a PPI, protonic, pain medications and antiemetics. Reported that patient was able to tolerate ice chips, reported that he is now trying to tolerate ginger ale.  1:47 AM This provider went to discuss discharge with patient. Patient reported that he still does not feel well and that he does not feel comfortable to go home.   MDM   Final diagnoses:  Gastritis  Intractable vomiting with nausea, vomiting of unspecified type  Epigastric pain    Medications  potassium chloride 10 mEq in 100 mL IVPB (10  mEq Intravenous New Bag/Given 06/21/14 0053)  ondansetron (ZOFRAN-ODT) disintegrating tablet 8 mg (8 mg Oral Given 06/20/14 1440)  oxyCODONE-acetaminophen (PERCOCET/ROXICET) 5-325 MG per tablet 1 tablet (1 tablet Oral Given 06/20/14 1440)  sodium chloride 0.9 % bolus 1,000 mL (0 mLs Intravenous Stopped 06/20/14 1740)  iohexol (OMNIPAQUE) 300 MG/ML solution 25 mL (25 mLs Oral Contrast Given 06/20/14 1617)  sodium chloride 0.9 % bolus 1,000 mL (0 mLs Intravenous Stopped 06/20/14 2018)  morphine 2 MG/ML injection 2 mg (2 mg Intravenous Given 06/20/14 1845)  metoCLOPramide (REGLAN) injection 5 mg (5 mg Intravenous Given 06/20/14 2014)  iohexol (OMNIPAQUE) 300 MG/ML solution 100 mL (100 mLs Intravenous Contrast Given 06/20/14 2024)  sodium chloride 0.9 % bolus 1,000 mL (1,000 mLs Intravenous New Bag/Given 06/20/14 2204)  sodium chloride 0.9 % bolus 1,000 mL (1,000 mLs Intravenous New Bag/Given 06/21/14 0005)  metoCLOPramide (REGLAN) injection 5 mg (5 mg Intravenous Given 06/21/14 0047)  pantoprazole (PROTONIX) injection 40 mg (40 mg  Intravenous Given 06/21/14 0103)  gi cocktail (Maalox,Lidocaine,Donnatal) (30 mLs Oral Given 06/21/14 0103)   Filed Vitals:   06/21/14 0000 06/21/14 0015 06/21/14 0030 06/21/14 0126  BP: 137/68 146/89 138/88 144/98  Pulse: 61 59 64 76  Temp:      TempSrc:      Resp:    14  SpO2: 98% 98% 97% 100%   This provider reviewed the patient's chart. Patient was seen in the ED setting admitted back in November 2015 regarding intractable nausea and vomiting. Patient was found to have a liver hemangioma on CT scan. Patient had EGD performed on 01/21/2014 with negative findings. Patient has not followed up with gastroenterology since November 2015. EKG noted sinus rhythm with a heart rate of 75 bpm, early repolarization - no change since last tracing. Troponin negative elevation. CBC noted mildly elevated leukocytes of 13.7. Hemoglobin 16.5, hematocrit 47.8. CMP noted hypokalemia 3.1, low chloride at 94. Glucose mildly elevated at 112 with negative elevated anion gap. Bilirubin elevated at 5.4. Lipase elevated at 89. Ethanol negative elevation. Magnesium unremarkable. Lactic acid negative elevation. Fecal occult negative. Urinalysis noted orange colored urine with small bilirubin and trace of leukocytes, elevated specific gravity 1.042. Patient dehydrated. Urine drug screen positive for cannabis. Abdominal ultrasound noted minimal sludge in the gallbladder with no findings of cholecystitis. Otherwise normal abdominal ultrasound. CT abdomen and pelvis with contrast noted air in the urinary bladder, this may be due to catheterization. Cystitis is gas-forming organism is not excluded however there is no bladder wall thickening or perivesicular inflammatory changes. No acute abnormality in abdomen or pelvis noted. Repeat CMP after 4 L of fluid identified a mild decrease in bilirubin from 5.4 to 4.8. Potassium has improved from 3.1 to 3.5. Patient continues to not feel well, no improvement. Patient to be admitted for  nausea and vomiting, intractable after 4 L of IV fluids, antiemetic medications. Patient has been given IV potassium, potassium has now improved. Patient to be admitted to [facility].  Jamse Mead, PA-C 06/21/14 0222  Daleen Bo, MD 06/23/14 [phone]

## 2014-06-20 NOTE — ED Notes (Signed)
The patient is unable to give an urine specimen at this time. The tech has reported to the RN in charge. 

## 2014-06-20 NOTE — ED Provider Notes (Signed)
  Face-to-face evaluation   History: Patient ill for 3 days with nausea and vomiting. His stool has been somewhat darker than usual. He has history of Gilbert's syndrome, and plans on following up with a gastroenterologist, next month, regarding hyperbilirubinemia. He drinks about once a week, currently now. He had a hangover, 2 days ago. He denies fever or chills. He has intermittent abdominal pain, for several days.    Physical exam: Alert, calm, cooperative. Heart regular rate and rhythm, no murmur. Lungs clear to auscultation. Abdomen- hyperactive bowel sounds, soft, mild diffuse tenderness.   1:30 AM Reevaluation with update and discussion. After initial assessment and treatment, an updated evaluation reveals after oral GI cocktail, he is able to tolerate ice chips with water and is now sipping on ginger ale. I discussed the findings with him. He is willing to go home and treat himself with a gradually advancing clear liquid diet, over 1-2 days before trying solid foods. Findings were discussed with the patient, all questions answered. Shanta Dorvil L   Medical decision making- nonspecific gastritis with vomiting. Possibly alcohol induced. History of Gilbert's disease with elevated total bilirubin. No signs of overt liver failure, metabolic instability, impending vascular collapse or serious bacterial infection.  Medical screening examination/treatment/procedure(s) were conducted as a shared visit with non-physician practitioner(s) and myself.  I personally evaluated the patient during the encounter  Daleen Bo, MD 06/23/14 [id number]

## 2014-06-20 NOTE — Consult Note (Addendum)
Hospitalist Consult Note  Patient name: Shaun Hays Medical record number: [id number] Date of birth: Nov 12, 1990 Age: 24 y.o. Gender: male  Primary Care Provider: Pcp Not In System  Chief Complaint: abd pain, UTI   History of Present Illness:This is a 24 y.o. year old male with significant past medical history of RUQ pain, hyperbilirubinemia  presenting with abd pain, UTI. Patient reports epigastric and generalized abdominal pain over the past 2-3 days as well as recurrent nausea or vomiting. Reports having a mild drinking binge at the beginning of the weekend. Has had recurrent abdominal pain, nausea or vomiting since this point. Decreased urine output. Feels that he has been dehydrated. Denies any other illicit drug use. Was drinking hard liquor consistently for 1-2 days. Note admission for abdominal pain back in November of last year. 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 Has not followed up as initially planned per patient. Denies any excessive ETOH use per pt.  Presented to the ER afebrile, hemodynamically stable. Noted white blood cell count 13.7, creatinine 0.99, potassium 3.1. T bili 5.4 (T bili 4.8 from last presentation). Abdominal ultrasound with minimal sludge in the gallbladder otherwise stable findings. CT of abdomen and pelvis shows in the urinary bladder status post catheterization with questionable cystitis. UA concerning for infection. UDS + for MJ. Otherwise within normal limits. Given IV Reglan and Zofran. Patient states he subjectively feels moderately improved. Would like to go home. Discussed overall risks and benefits of going home versus hospital admission. Patient agreeable to going home.  Assessment and Plan: Shaun Hays is a 24 y.o. 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 -ETOH level pending  -GI cocktail 1 -scheduled PPI -Schedule antibiotics -Follow up with gastroenterology as previously discussed. 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  -f/u w/ GI as previously discussed    Patient Active Problem List   Diagnosis Date Noted  . Hyperbilirubinemia 01/19/2014  . Right upper quadrant pain 01/19/2014  . Elevated bilirubin   . Nausea with vomiting   . RUQ pain    Past Medical History: Past Medical History  Diagnosis Date  . Gastritis   . 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 Hemangioma of liver 01/2014    Past Surgical History: Past Surgical History  Procedure Laterality Date  . No past surgeries    . Esophagogastroduodenoscopy N/A 01/21/2014    Procedure: ESOPHAGOGASTRODUODENOSCOPY (EGD);  Surgeon: Milus Banister, MD;  Location: [facility];  Service: Endoscopy;  Laterality: N/A;    Social History: History   Social History  . Marital Status: Single    Spouse Name: N/A  . Number of Children: N/A  . Years of Education: N/A   Social History Main Topics  . Smoking status: Former Smoker -- 5 years    Types: Cigars    Quit date: 01/14/2014  . Smokeless tobacco: Never Used  . Alcohol Use: Yes  Comment: 01/19/2014 "2-3 mixed drinks maybe 3 times/month"  . Drug Use: Yes    Special: Marijuana     Comment: 01/19/2014 "couple times/wk"  . Sexual Activity: Yes   Other Topics Concern  . None   Social History Narrative    Family History: History reviewed. No pertinent family history.  Allergies: Allergies  Allergen Reactions  . Diphenhydramine Swelling     Current Facility-Administered Medications  Medication Dose Route Frequency Provider Last Rate Last Dose  . sodium chloride 0.9 % bolus 1,000 mL  1,000 mL Intravenous Once Jamse Mead, PA-C       Current Outpatient Prescriptions  Medication Sig Dispense Refill  . Fructose-Dextrose-Phosphor Acd ([facility] NAUSEA RELIEF PO) Take 1 tablet by mouth daily as needed (nausea, abdominal pain).    . PEDIALYTE (PEDIALYTE) SOLN Take 120 mLs by mouth every 6 (six) hours as needed (upset stomach).     Review Of Systems: 12 point ROS negative except as noted above in HPI.  Physical Exam: Filed Vitals:   06/20/14 2122  BP: 160/92  Pulse: 82  Temp:   Resp:     General: alert and cooperative HEENT: PERRLA and extra ocular movement intact Heart: S1, S2 normal, no murmur, rub or gallop, regular rate and rhythm Lungs: clear to auscultation, no wheezes or rales and unlabored breathing Abdomen: abdomen is soft without significant tenderness, masses, organomegaly or guarding Extremities: extremities normal, atraumatic, no cyanosis or edema Skin:no rashes Neurology: normal without focal findings  Labs and Imaging: Lab Results  Component Value Date/Time   NA 136 06/20/2014 01:25 PM   K 3.1* 06/20/2014 01:25 PM   CL 94* 06/20/2014 01:25 PM   CO2 27 06/20/2014 01:25 PM   BUN 17 06/20/2014 01:25 PM   CREATININE 0.99 06/20/2014 01:25 PM   GLUCOSE 112* 06/20/2014 01:25 PM   Lab Results  Component Value Date   WBC 13.7* 06/20/2014   HGB 16.5 06/20/2014   HCT 47.8 06/20/2014   MCV 91.2 06/20/2014   PLT 256 06/20/2014   Urinalysis    Component Value Date/Time   COLORURINE ORANGE* 06/20/2014 1750   APPEARANCEUR CLEAR 06/20/2014 1750   LABSPEC 1.042* 06/20/2014 1750   PHURINE 6.0 06/20/2014 1750   GLUCOSEU NEGATIVE 06/20/2014 1750   HGBUR NEGATIVE 06/20/2014 1750   BILIRUBINUR SMALL* 06/20/2014 1750   KETONESUR >80* 06/20/2014 1750   PROTEINUR 100* 06/20/2014 1750   UROBILINOGEN 1.0  06/20/2014 1750   NITRITE NEGATIVE 06/20/2014 1750   LEUKOCYTESUR TRACE* 06/20/2014 1750       US Abdomen Complete  06/20/2014   CLINICAL DATA:  Abdominal pain and emesis for 3 days.  EXAM: ULTRASOUND ABDOMEN COMPLETE  COMPARISON:  01/19/2014  FINDINGS: Gallbladder: Physiologically distended. Minimal sludge in the gallbladder lumen. No gallstones or wall thickening visualized. No sonographic Murphy sign noted.  Common bile duct: Diameter: 4.6 mm.  Liver: No focal lesion identified. Within normal limits in parenchymal echogenicity.  IVC: No abnormality visualized.  Pancreas: Visualized portion unremarkable.  Spleen: Size and appearance within normal limits.  Right Kidney: Length: 11.6 cm. Echogenicity within normal limits. No mass or hydronephrosis visualized.  Left Kidney: Length: 10.3 cm. Echogenicity within normal limits. No mass or hydronephrosis visualized.  Abdominal aorta: No aneurysm visualized.  Other findings: None.  No ascites.  IMPRESSION: 1. Minimal sludge in the gallbladder.  No findings of cholecystitis. 2. Otherwise normal abdominal ultrasound.   Electronically Signed   By: Jeb Levering M.D.   On: 06/20/2014 20:19   Ct Abdomen  Pelvis W Contrast  06/20/2014   CLINICAL DATA:  Abdominal pain and emesis for 3 days.  Fever.  EXAM: CT ABDOMEN AND PELVIS WITH CONTRAST  TECHNIQUE: Multidetector CT imaging of the abdomen and pelvis was performed using the standard protocol following bolus administration of intravenous contrast.  CONTRAST:  54mL OMNIPAQUE IOHEXOL 300 MG/ML SOLN, 128mL OMNIPAQUE IOHEXOL 300 MG/ML SOLN  COMPARISON:  Abdominal ultrasound earlier this day.  FINDINGS: The included lung bases are clear.  The liver, gallbladder, spleen, pancreas, adrenal glands, and kidneys are normal. There is symmetric renal enhancement without hydronephrosis.  Stomach is physiologically distended. There are no dilated or thickened bowel loops. The appendix is filled with air in contrast and is  normal. Small volume of colonic stool, no colonic wall thickening. No free air, free fluid, or intra-abdominal fluid collection. Abdominal aorta is normal in caliber. No retroperitoneal adenopathy.  Within the pelvis the urinary bladder is physiologically distended. There is a moderate volume of intravesicular air, no bladder wall thickening or perivesicular inflammatory change. Prostate gland is normal in size. There is no pelvic free fluid. No pelvic adenopathy.  There are no acute or suspicious osseous abnormalities.  IMPRESSION: 1. Air in the urinary bladder. This may be related to recent catheterization. Cystitis with gas-forming organism is not excluded, however there is no bladder wall thickening or perivesicular inflammatory change. 2. Otherwise no acute abnormality in the abdomen/pelvis.   Electronically Signed   By: Jeb Levering M.D.   On: 06/20/2014 21:16           Shanda Howells MD  Pager: [phone]

## 2014-06-21 ENCOUNTER — Encounter (HOSPITAL_COMMUNITY): Payer: Self-pay | Admitting: *Deleted

## 2014-06-21 ENCOUNTER — Telehealth: Payer: Self-pay | Admitting: Gastroenterology

## 2014-06-21 DIAGNOSIS — G43A1 Cyclical vomiting, intractable: Secondary | ICD-10-CM | POA: Diagnosis not present

## 2014-06-21 DIAGNOSIS — R112 Nausea with vomiting, unspecified: Secondary | ICD-10-CM | POA: Diagnosis present

## 2014-06-21 DIAGNOSIS — R1084 Generalized abdominal pain: Secondary | ICD-10-CM

## 2014-06-21 DIAGNOSIS — N390 Urinary tract infection, site not specified: Secondary | ICD-10-CM | POA: Diagnosis not present

## 2014-06-21 LAB — CBC WITH DIFFERENTIAL/PLATELET
Basophils Absolute: 0.1 10*3/uL (ref 0.0–0.1)
Basophils Relative: 1 % (ref 0–1)
EOS PCT: 0 % (ref 0–5)
Eosinophils Absolute: 0 10*3/uL (ref 0.0–0.7)
HCT: 41.8 % (ref 39.0–52.0)
Hemoglobin: 13.9 g/dL (ref 13.0–17.0)
LYMPHS PCT: 25 % (ref 12–46)
Lymphs Abs: 2.5 10*3/uL (ref 0.7–4.0)
MCH: 30.9 pg (ref 26.0–34.0)
MCHC: 33.3 g/dL (ref 30.0–36.0)
MCV: 92.9 fL (ref 78.0–100.0)
MONOS PCT: 10 % (ref 3–12)
Monocytes Absolute: 1 10*3/uL (ref 0.1–1.0)
NEUTROS PCT: 64 % (ref 43–77)
Neutro Abs: 6.5 10*3/uL (ref 1.7–7.7)
PLATELETS: 238 10*3/uL (ref 150–400)
RBC: 4.5 MIL/uL (ref 4.22–5.81)
RDW: 12.9 % (ref 11.5–15.5)
WBC: 10.1 10*3/uL (ref 4.0–10.5)

## 2014-06-21 LAB — COMPREHENSIVE METABOLIC PANEL
ALK PHOS: 44 U/L (ref 39–117)
ALT: 20 U/L (ref 0–53)
ALT: 20 U/L (ref 0–53)
AST: 24 U/L (ref 0–37)
AST: 19 U/L (ref 0–37)
Albumin: 3.9 g/dL (ref 3.5–5.2)
Albumin: 3.6 g/dL (ref 3.5–5.2)
Alkaline Phosphatase: 42 U/L (ref 39–117)
Anion gap: 9 (ref 5–15)
Anion gap: 11 (ref 5–15)
BILIRUBIN TOTAL: 4.4 mg/dL — AB (ref 0.3–1.2)
BUN: 12 mg/dL (ref 6–23)
BUN: 9 mg/dL (ref 6–23)
CHLORIDE: 99 mmol/L (ref 96–112)
CO2: 24 mmol/L (ref 19–32)
CO2: 29 mmol/L (ref 19–32)
CREATININE: 0.78 mg/dL (ref 0.50–1.35)
CREATININE: 1.01 mg/dL (ref 0.50–1.35)
Calcium: 8.6 mg/dL (ref 8.4–10.5)
Calcium: 8.5 mg/dL (ref 8.4–10.5)
Chloride: 101 mmol/L (ref 96–112)
GFR calc Af Amer: >90 mL/min (ref >90)
GLUCOSE: 90 mg/dL (ref 70–99)
GLUCOSE: 96 mg/dL (ref 70–99)
Potassium: 3.5 mmol/L (ref 3.5–5.1)
Potassium: 3.6 mmol/L (ref 3.5–5.1)
Sodium: 137 mmol/L (ref 135–145)
Sodium: 136 mmol/L (ref 135–145)
Total Bilirubin: 4.8 mg/dL — ABNORMAL HIGH (ref 0.3–1.2)
Total Protein: 7 g/dL (ref 6.0–8.3)
Total Protein: 6.3 g/dL (ref 6.0–8.3)

## 2014-06-21 LAB — HIV ANTIBODY (ROUTINE TESTING W REFLEX): HIV Screen 4th Generation wRfx: NONREACTIVE

## 2014-06-21 LAB — CBC
HCT: 41.7 % (ref 39.0–52.0)
HEMOGLOBIN: 13.8 g/dL (ref 13.0–17.0)
MCH: 30.6 pg (ref 26.0–34.0)
MCHC: 33.1 g/dL (ref 30.0–36.0)
MCV: 92.5 fL (ref 78.0–100.0)
Platelets: 222 10*3/uL (ref 150–400)
RBC: 4.51 MIL/uL (ref 4.22–5.81)
RDW: 12.9 % (ref 11.5–15.5)
WBC: 9.9 10*3/uL (ref 4.0–10.5)

## 2014-06-21 LAB — RPR: RPR: NONREACTIVE

## 2014-06-21 MED ORDER — ONDANSETRON HCL 4 MG PO TABS: 4.0000 mg | ORAL_TABLET | Freq: Four times a day (QID) | ORAL | Status: AC | PRN

## 2014-06-21 MED ORDER — CIPROFLOXACIN HCL 500 MG PO TABS
500.0000 mg | ORAL_TABLET | Freq: Two times a day (BID) | ORAL | Status: DC
  Administered 2014-06-21: 500 mg via ORAL
  Filled 2014-06-21 (×3): qty 1

## 2014-06-21 MED ORDER — PANTOPRAZOLE SODIUM 40 MG IV SOLR
40.0000 mg | Freq: Once | INTRAVENOUS | Status: AC
  Administered 2014-06-21: 40 mg via INTRAVENOUS
  Filled 2014-06-21: qty 40

## 2014-06-21 MED ORDER — METOCLOPRAMIDE HCL 5 MG/ML IJ SOLN
5.0000 mg | Freq: Once | INTRAMUSCULAR | Status: AC
  Administered 2014-06-21: 5 mg via INTRAVENOUS
  Filled 2014-06-21: qty 2

## 2014-06-21 MED ORDER — HEPARIN SODIUM (PORCINE) 5000 UNIT/ML IJ SOLN
5000.0000 [IU] | Freq: Three times a day (TID) | INTRAMUSCULAR | Status: DC
  Filled 2014-06-21 (×3): qty 1

## 2014-06-21 MED ORDER — CIPROFLOXACIN HCL 500 MG PO TABS: 500.0000 mg | ORAL_TABLET | Freq: Two times a day (BID) | ORAL | Status: AC

## 2014-06-21 MED ORDER — ONDANSETRON HCL 4 MG PO TABS: 4.0000 mg | ORAL_TABLET | Freq: Four times a day (QID) | ORAL | Status: DC | PRN

## 2014-06-21 MED ORDER — PANTOPRAZOLE SODIUM 40 MG PO TBEC: 40.0000 mg | DELAYED_RELEASE_TABLET | Freq: Every day | ORAL | Status: AC

## 2014-06-21 MED ORDER — GI COCKTAIL ~~LOC~~
30.0000 mL | Freq: Once | ORAL | Status: AC
  Administered 2014-06-21: 30 mL via ORAL
  Filled 2014-06-21: qty 30

## 2014-06-21 MED ORDER — POTASSIUM CHLORIDE IN NACL 20-0.9 MEQ/L-% IV SOLN
INTRAVENOUS | Status: DC
  Administered 2014-06-21: 03:00:00 via INTRAVENOUS
  Filled 2014-06-21 (×4): qty 1000

## 2014-06-21 MED ORDER — ONDANSETRON HCL 4 MG/2ML IJ SOLN
4.0000 mg | Freq: Four times a day (QID) | INTRAMUSCULAR | Status: DC | PRN
  Administered 2014-06-21: 4 mg via INTRAVENOUS
  Filled 2014-06-21: qty 2

## 2014-06-21 MED ORDER — PANTOPRAZOLE SODIUM 40 MG PO TBEC
40.0000 mg | DELAYED_RELEASE_TABLET | Freq: Every day | ORAL | Status: DC
  Administered 2014-06-21: 40 mg via ORAL
  Filled 2014-06-21: qty 1

## 2014-06-21 NOTE — Progress Notes (Signed)
Arrived to floor at this time via stretcher from ER; able to ambulate to side of bed.  Alert and able to voice needs.  Answers questions appropriately.  Instructed on use of call system and placed within reach.  Bed placed in low position. Will continue to monitor.

## 2014-06-21 NOTE — Discharge Instructions (Signed)
Please call your doctor for a followup appointment within 24-48 hours. When you talk to your doctor please let them know that you were seen in the emergency department and have them acquire all of your records so that they can discuss the findings with you and formulate a treatment plan to fully care for your new and ongoing problems. Please report back to the emergency department if symptoms do not improve within 24 hours Please follow-up with health and wellness Center Please follow-up with gastroenterology  Please rest and stay hydrated-please drink plenty of water. Please drink and is very small amounts. Please avoid food that are high in fat, grease, oil, spices for this can lead to worsening symptoms. Please take medications as prescribed  Please take medications as prescribed - while on pain medications there is to be no drinking alcohol, driving, operating any heavy machinery. If extra please dispose in a proper manner. Please do not take any extra Tylenol with this medication for this can lead to Tylenol overdose and liver issues.  Please continue to monitor symptoms closely and if symptoms are to worsen or change (fever greater than 101, chills, sweating, nausea, vomiting, chest pain, shortness of breathe, difficulty breathing, weakness, numbness, tingling, worsening or changes to pain pattern, inability keep food or fluids down, headache, dizziness, room spinning sensation, blood in the stools, black tarry stools, coughing up blood, vomiting of blood, decreased urination) please report back to the Emergency Department immediately.    Gastritis, Adult Gastritis is soreness and swelling (inflammation) of the lining of the stomach. Gastritis can develop as a sudden onset (acute) or long-term (chronic) condition. If gastritis is not treated, it can lead to stomach bleeding and ulcers. CAUSES  Gastritis occurs when the stomach lining is weak or damaged. Digestive juices from the stomach then  inflame the weakened stomach lining. The stomach lining may be weak or damaged due to viral or bacterial infections. One common bacterial infection is the Helicobacter pylori infection. Gastritis can also result from excessive alcohol consumption, taking certain medicines, or having too much acid in the stomach.  SYMPTOMS  In some cases, there are no symptoms. When symptoms are present, they may include:  Pain or a burning sensation in the upper abdomen.  Nausea.  Vomiting.  An uncomfortable feeling of fullness after eating. DIAGNOSIS  Your caregiver may suspect you have gastritis based on your symptoms and a physical exam. To determine the cause of your gastritis, your caregiver may perform the following:  Blood or stool tests to check for the H pylori bacterium.  Gastroscopy. A thin, flexible tube (endoscope) is passed down the esophagus and into the stomach. The endoscope has a light and camera on the end. Your caregiver uses the endoscope to view the inside of the stomach.  Taking a tissue sample (biopsy) from the stomach to examine under a microscope. TREATMENT  Depending on the cause of your gastritis, medicines may be prescribed. If you have a bacterial infection, such as an H pylori infection, antibiotics may be given. If your gastritis is caused by too much acid in the stomach, H2 blockers or antacids may be given. Your caregiver may recommend that you stop taking aspirin, ibuprofen, or other nonsteroidal anti-inflammatory drugs (NSAIDs). HOME CARE INSTRUCTIONS  Only take over-the-counter or prescription medicines as directed by your caregiver.  If you were given antibiotic medicines, take them as directed. Finish them even if you start to feel better.  Drink enough fluids to keep your urine clear or  pale yellow.  Avoid foods and drinks that make your symptoms worse, such as:  Caffeine or alcoholic drinks.  Chocolate.  Peppermint or mint flavorings.  Garlic and  onions.  Spicy foods.  Citrus fruits, such as oranges, lemons, or limes.  Tomato-based foods such as sauce, chili, salsa, and pizza.  Fried and fatty foods.  Eat small, frequent meals instead of large meals. SEEK IMMEDIATE MEDICAL CARE IF:   You have black or dark red stools.  You vomit blood or material that looks like coffee grounds.  You are unable to keep fluids down.  Your abdominal pain gets worse.  You have a fever.  You do not feel better after 1 week.  You have any other questions or concerns. MAKE SURE YOU:  Understand these instructions.  Will watch your condition.  Will get help right away if you are not doing well or get worse. Document Released: 02/19/2001 Document Revised: 08/27/2011 Document Reviewed: 04/10/2011 [facility] Patient Information 2015 [place], [place]. This information is not intended to replace advice given to you by your health care provider. Make sure you discuss any questions you have with your health care provider.  Urinary Tract Infection Urinary tract infections (UTIs) can develop anywhere along your urinary tract. Your urinary tract is your body's drainage system for removing wastes and extra water. Your urinary tract includes two kidneys, two ureters, a bladder, and a urethra. Your kidneys are a pair of bean-shaped organs. Each kidney is about the size of your fist. They are located below your ribs, one on each side of your spine. CAUSES Infections are caused by microbes, which are microscopic organisms, including fungi, viruses, and bacteria. These organisms are so small that they can only be seen through a microscope. Bacteria are the microbes that most commonly cause UTIs. SYMPTOMS  Symptoms of UTIs may vary by age and gender of the patient and by the location of the infection. Symptoms in young women typically include a frequent and intense urge to urinate and a painful, burning feeling in the bladder or urethra during urination. Older  women and men are more likely to be tired, shaky, and weak and have muscle aches and abdominal pain. A fever may mean the infection is in your kidneys. Other symptoms of a kidney infection include pain in your back or sides below the ribs, nausea, and vomiting. DIAGNOSIS To diagnose a UTI, your caregiver will ask you about your symptoms. Your caregiver also will ask to provide a urine sample. The urine sample will be tested for bacteria and white blood cells. White blood cells are made by your body to help fight infection. TREATMENT  Typically, UTIs can be treated with medication. Because most UTIs are caused by a bacterial infection, they usually can be treated with the use of antibiotics. The choice of antibiotic and length of treatment depend on your symptoms and the type of bacteria causing your infection. HOME CARE INSTRUCTIONS  If you were prescribed antibiotics, take them exactly as your caregiver instructs you. Finish the medication even if you feel better after you have only taken some of the medication.  Drink enough water and fluids to keep your urine clear or pale yellow.  Avoid caffeine, tea, and carbonated beverages. They tend to irritate your bladder.  Empty your bladder often. Avoid holding urine for long periods of time.  Empty your bladder before and after sexual intercourse.  After a bowel movement, women should cleanse from front to back. Use each tissue only once.  SEEK MEDICAL CARE IF:   You have back pain.  You develop a fever.  Your symptoms do not begin to resolve within 3 days. SEEK IMMEDIATE MEDICAL CARE IF:   You have severe back pain or lower abdominal pain.  You develop chills.  You have nausea or vomiting.  You have continued burning or discomfort with urination. MAKE SURE YOU:   Understand these instructions.  Will watch your condition.  Will get help right away if you are not doing well or get worse. Document Released: 12/05/2004 Document  Revised: 08/27/2011 Document Reviewed: 04/05/2011 [facility] Patient Information 2015 [place], [place]. This information is not intended to replace advice given to you by your health care provider. Make sure you discuss any questions you have with your health care provider.    Emergency Department Resource Guide 1) Find a Doctor and Pay Out of Pocket Although you won't have to find out who is covered by your insurance plan, it is a good idea to ask around and get recommendations. You will then need to call the office and see if the doctor you have chosen will accept you as a new patient and what types of options they offer for patients who are self-pay. Some doctors offer discounts or will set up payment plans for their patients who do not have insurance, but you will need to ask so you aren't surprised when you get to your appointment.  2) Contact Your Local Health Department Not all health departments have doctors that can see patients for sick visits, but many do, so it is worth a call to see if yours does. If you don't know where your local health department is, you can check in your phone book. The CDC also has a tool to help you locate your state's health department, and many state websites also have listings of all of their local health departments.  3) Find a [facility] If your illness is not likely to be very severe or complicated, you may want to try a walk in clinic. These are popping up all over the country in pharmacies, drugstores, and shopping centers. They're usually staffed by nurse practitioners or physician assistants that have been trained to treat common illnesses and complaints. They're usually fairly quick and inexpensive. However, if you have serious medical issues or chronic medical problems, these are probably not your best option.  No Primary Care Doctor: - Call Health Connect at  [phone] - they can help you locate a primary care doctor that  accepts your insurance,  provides certain services, etc. - Physician Referral Service- [phone]  Chronic Pain Problems: Organization         Address  Phone   Notes  [facility]  [phone] Patients need to be referred by their primary care doctor.   Medication Assistance: Organization         Address  Phone   Notes  [facility] Medication [facility] [address] [place], [place] [address] [phone] --Must be a resident of [facility] -- Must have NO insurance coverage whatsoever (no Medicaid/ Medicare, etc.) -- The pt. MUST have a primary care doctor that directs their care regularly and follows them in the community   MedAssist  [phone]   [employer]  [phone]    Agencies that provide inexpensive medical care: Organization         Address  Phone   Notes  [place]  [phone])  [facility]    [phone]   [facility] [place], [place] [address] [phone]   [place] [address] [place] [phone]   Planned Parenthood    [phone]   [facility]    [phone]   [place] and [address], [place] Phone:  [phone], Fax:  [phone] Hours of Operation:  9 am - 6 pm, M-F.  Also accepts Medicaid/Medicare and self-pay.  [facility] for [place] [address], [address], [place] Phone: [phone], Fax: [phone]. Hours of Operation:  8:30 am - 5:30 pm, M-F.  Also accepts Medicaid and self-pay.  [facility] [place] [address], [place] Phone: [phone]   [place], [place], [place] [phone], Ext. 123 Mondays & Thursdays: 7-9 AM.  First 15 patients are seen on a first come, first serve basis.    [place] Providers:  Organization         Address  Phone    Notes  [facility] [address], [place] [phone] Also accepts self-pay patients.  [facility] [address], [place]  [phone]   [place], [address], [place] [phone]   [facility] [address] [place] [phone]   Lucianne Lei [address], [place]   [phone] Only accepts [place] Access [place] patients after they have their name applied to their card.   Self-Pay (no insurance) in [facility]:  Organization         Address  Phone   Notes  Sickle Cell Patients, [facility] Internal Medicine [place] [phone]   [facility] Urgent Care [place] [phone]   [facility] [place]  [place], [place], [place] [phone]   [facility]/Dr. Osei-Bonsu  [address] [place] or [address], [place] [phone] Phone number for both [place] and [place] locations is the same.  [facility]gent [facility] and [facility] [address] [place] [phone]   [facility] [address] [place] or [address] [phone]   [facility] [address], [place] [phone], phone; [phone], fax Sees patients 1st and 3rd Saturday of every month.  Must not qualify for public or private insurance (i.e. Medicaid, Medicare, [place] Health Choice, Veterans' Benefits)  Household income should be no more than 200% of the poverty level The clinic cannot treat you if you are pregnant or think you are pregnant  Sexually transmitted diseases are not treated at the clinic.    Dental Care: Organization         Address  Phone  Notes  [facility] Department of [facility] [place] [phone] Accepts children up to age 52 who are enrolled in [place] or [place]; pregnant women with a Medicaid card; and children who have applied for Medicaid or [place] Health Choice, but were declined, whose parents can pay a reduced fee at time of service.  [place]  [address], [facility] [phone] Accepts children up to age 68 who are enrolled in Medicaid or [place]; pregnant women with a Medicaid card; and children who have applied for Medicaid or [place] Health Choice, but were declined, whose parents can pay a reduced fee at time of service.  [place] Adult Dental Access PROGRAM  [place] [phone] Patients are seen by appointment only. Walk-ins are not accepted. [place] will see patients 77 years of age and older. Monday - Tuesday (8am-5pm) Most Wednesdays (8:30-5pm) $30 per visit, cash only  [facility] Adult Dental Access PROGRAM  [address], [facility] [phone] Patients are seen by appointment only. Walk-ins are not accepted. [place] will see patients 16 years of age and older. One Wednesday Evening (Monthly: Volunteer Based).  $30 per visit, cash only  [place]  [phone] for adults; Children under age 9, call Graduate Pediatric Dentistry at [phone]. Children aged 85-14, please call [phone] to request a pediatric application.  Dental services are provided in all areas of dental care including fillings, crowns and bridges, complete and partial dentures, implants, gum treatment, root canals, and extractions. Preventive care is also provided. Treatment is provided to both adults and children. Patients are selected via a lottery and there is often a waiting list.   [facility] [address] [place]  [phone] [email or web address]   [facility] [address] [place], [place] [phone], Ext.  123 Second and Fourth Thursday of each month, opens at 6:30 AM; Clinic ends at 9 AM.  Patients are seen on a first-come first-served basis, and a limited number are seen during each clinic.   [facility]  [address] Hillard Danker [place], [place] [phone]   Eligibility Requirements You must have lived in [place], [place], or [place] counties for at least the last three months.   You cannot be eligible for state or federal sponsored [employer], including [employer], [place], or [employer].   You generally cannot be eligible for healthcare insurance through your employer.    How to apply: Eligibility screenings are held every Tuesday and Wednesday afternoon from 1:00 pm until 4:00 pm. You do not need an appointment for the interview!  [facility] [address] [place], [place]   [place]  [place]illi[place] Department  [place]  [phone]    Behavioral Health Resources in the Community: Intensive Outpatient Programs Organization         Address  Phone  Notes  [place]. [address] [place], [place] [phone]   [facility] Outpatient [address] [place], [place]   ADS: Alcohol & Drug Svcs [address], [place], [place]   [place] [address],  [place], [place] or [phone]   Substance Abuse Resources Organization         Address  Phone  Notes  Alcohol and Drug Services  [phone]   [place]  [phone]   [facility] [place]   Chinita Pester  [phone]   Residential & Outpatient Substance Abuse Program  [phone]   Psychological Services Organization         Address  Phone  Notes  [place]  [place]  [place] [address], [place] or [phone]    Mobile Crisis Teams Organization         Address  Phone  Notes  [facility], Mobile Crisis Care Unit  [phone]   [facility]  [address] [place], [place]   [place] [address] [place] [phone]    Self-Help/Support Groups Organization         Address  Phone             Notes  [place]. of [place] - variety of support groups  [place] Call for more information  Narcotics Anonymous (NA), [facility] [address], [employer] [place]  2 meetings at this location   Special educational needs teacher         Address  Phone  Notes  ASAP Residential Treatment [place],    [place]  [phone]   [facility]  [address] [place] [phone], [place], [place]   [place], [place] [phone] Admissions: 8am-3pm M-F  Incentives Substance [place] [address],    [place], [place] [phone]   [facility] [address] [place], [place], [place]   [facility] [address],  [place], [place]   [facility] Programs - Intensive Outpatient [address]een [address], [place], [place]   [facility] [place]  [place], [place] [phone] or [phone]   [facility] (RT[facility]) [address], [place], [place] Accepts Medicaid  [facility] [place] [address],  [place] [phone] Substance Abuse/Addiction Treatment   [facility] Organization         Address  Phone  Notes  [facility]  [phone]   Domenic Schwab, PhD [address] Arlis Porta [place], [place]   [phone] or [phone]   [place]  [place], [place] [phone]   [facility] [address], [place], [place] [phone] Insurance/Medicaid/sponsorship through [facility] and Families [address], [address] [place]                                    [place], [place] [phone] [place] [address], [place] [phone]    Dr. Adele Schilder  [phone]   [facility] Dept. 1) [address], [place] 2) [place] 3)  [place] [address], [place] [phone]  [phone]   [place] [phone] or [phone] (After Hours)

## 2014-06-21 NOTE — Discharge Summary (Signed)
PATIENT DETAILS Name: Shaun Hays Age: 24 y.o. Sex: male Date of Birth: [birth year] MRN: [id number]. Admitting Physician: Deneise Lever, MD PCP:Pcp Not In System  Admit Date: 06/20/2014 Discharge date: 06/21/2014  Recommendations for Outpatient Follow-up:  1. Ensure follow-up with gastroenterology-[place] [facility] will contact patient for follow-up 2. Follow urine cultures-pending at the time of discharge  PRIMARY DISCHARGE DIAGNOSIS:  Active Problems:   Abdominal pain   UTI (lower urinary tract infection)   Nausea & vomiting      PAST MEDICAL HISTORY: Past Medical History  Diagnosis Date  . Gastritis   . 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 Hemangioma of liver 01/2014    DISCHARGE MEDICATIONS: Current Discharge Medication List    START taking these medications   Details  ciprofloxacin (CIPRO) 500 MG tablet Take 1 tablet (500 mg total) by mouth 2 (two) times daily. Qty: 10 tablet, Refills: 0    ondansetron (ZOFRAN) 4 MG tablet Take 1 tablet (4 mg total) by mouth every 6 (six) hours as needed for nausea. Qty: 20 tablet, Refills: 0    pantoprazole (PROTONIX) 40 MG tablet Take 1 tablet (40 mg total) by mouth daily. Qty: 30 tablet, Refills: 0      CONTINUE these medications which have NOT CHANGED   Details  Fructose-Dextrose-Phosphor Acd ([employer] NAUSEA RELIEF PO) Take 1 tablet by mouth daily as needed (nausea, abdominal pain).    PEDIALYTE (PEDIALYTE) SOLN Take 120 mLs by mouth every 6 (six) hours as needed (upset stomach).        ALLERGIES:   Allergies  Allergen Reactions  . Diphenhydramine Swelling    BRIEF HPI:  See H&P, Labs, Consult and Test reports for all details in brief, patient was admitted for elevation of persistent nausea and vomiting.  CONSULTATIONS:   None  PERTINENT RADIOLOGIC STUDIES: US Abdomen Complete  06/20/2014   CLINICAL DATA:  Abdominal pain and emesis for 3 days.  EXAM: ULTRASOUND  ABDOMEN COMPLETE  COMPARISON:  01/19/2014  FINDINGS: Gallbladder: Physiologically distended. Minimal sludge in the gallbladder lumen. No gallstones or wall thickening visualized. No sonographic Murphy sign noted.  Common bile duct: Diameter: 4.6 mm.  Liver: No focal lesion identified. Within normal limits in parenchymal echogenicity.  IVC: No abnormality visualized.  Pancreas: Visualized portion unremarkable.  Spleen: Size and appearance within normal limits.  Right Kidney: Length: 11.6 cm. Echogenicity within normal limits. No mass or hydronephrosis visualized.  Left Kidney: Length: 10.3 cm. Echogenicity within normal limits. No mass or hydronephrosis visualized.  Abdominal aorta: No aneurysm visualized.  Other findings: None.  No ascites.  IMPRESSION: 1. Minimal sludge in the gallbladder.  No findings of cholecystitis. 2. Otherwise normal abdominal ultrasound.   Electronically Signed   By: Jeb Levering M.D.   On: 06/20/2014 20:19   Ct Abdomen Pelvis W Contrast  06/20/2014   CLINICAL DATA:  Abdominal pain and emesis for 3 days.  Fever.  EXAM: CT ABDOMEN AND PELVIS WITH CONTRAST  TECHNIQUE: Multidetector CT imaging of the abdomen and pelvis was performed using the standard protocol following bolus administration of intravenous contrast.  CONTRAST:  67mL OMNIPAQUE IOHEXOL 300 MG/ML SOLN, 115mL OMNIPAQUE IOHEXOL 300 MG/ML SOLN  COMPARISON:  Abdominal ultrasound earlier this day.  FINDINGS: The included lung bases are clear.  The liver, gallbladder, spleen, pancreas, adrenal glands, and kidneys are normal. There is symmetric renal enhancement without hydronephrosis.  Stomach is physiologically distended. There are no dilated or thickened  bowel loops. The appendix is filled with air in contrast and is normal. Small volume of colonic stool, no colonic wall thickening. No free air, free fluid, or intra-abdominal fluid collection. Abdominal aorta is normal in caliber. No retroperitoneal adenopathy.  Within the  pelvis the urinary bladder is physiologically distended. There is a moderate volume of intravesicular air, no bladder wall thickening or perivesicular inflammatory change. Prostate gland is normal in size. There is no pelvic free fluid. No pelvic adenopathy.  There are no acute or suspicious osseous abnormalities.  IMPRESSION: 1. Air in the urinary bladder. This may be related to recent catheterization. Cystitis with gas-forming organism is not excluded, however there is no bladder wall thickening or perivesicular inflammatory change. 2. Otherwise no acute abnormality in the abdomen/pelvis.   Electronically Signed   By: Jeb Levering M.D.   On: 06/20/2014 21:16     PERTINENT LAB RESULTS: CBC:  Recent Labs  06/21/14 0237 06/21/14 0650  WBC 9.9 10.1  HGB 13.8 13.9  HCT 41.7 41.8  PLT 222 238   CMET CMP     Component Value Date/Time   NA 136 06/21/2014 0650   K 3.6 06/21/2014 0650   CL 101 06/21/2014 0650   CO2 24 06/21/2014 0650   GLUCOSE 90 06/21/2014 0650   BUN 9 06/21/2014 0650   CREATININE 0.78 06/21/2014 0650   CALCIUM 8.5 06/21/2014 0650   PROT 6.3 06/21/2014 0650   ALBUMIN 3.6 06/21/2014 0650   AST 19 06/21/2014 0650   ALT 20 06/21/2014 0650   ALKPHOS 42 06/21/2014 0650   BILITOT 4.4* 06/21/2014 0650   GFRNONAA >90 06/21/2014 0650   GFRAA >90 06/21/2014 0650    GFR Estimated Creatinine Clearance: 117.6 mL/min (by C-G formula based on Cr of 0.78).  Recent Labs  06/20/14 1325  LIPASE 89*    Recent Labs  06/20/14 1632  TROPONINI <0.03   Invalid input(s): POCBNP No results for input(s): DDIMER in the last 72 hours. No results for input(s): HGBA1C in the last 72 hours. No results for input(s): CHOL, HDL, LDLCALC, TRIG, CHOLHDL, LDLDIRECT in the last 72 hours. No results for input(s): TSH, T4TOTAL, T3FREE, THYROIDAB in the last 72 hours.  Invalid input(s): FREET3 No results for input(s): VITAMINB12, FOLATE, FERRITIN, TIBC, IRON, RETICCTPCT in the last 72  hours. Coags: No results for input(s): INR in the last 72 hours.  Invalid input(s): PT Microbiology: No results found for this or any previous visit (from the past 240 hour(s)).   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 Patient was admitted and provided with supportive care. By day of discharge able to tolerate diet. Will be placed on a short-term PPI. Has been asked to avoid alcohol use in the future. Abdomen is completely benign, patient had a EGD in November 2015 that did not show any major abnormalities.    UTI: CT scan of the abdomen showed air in the urinary bladder. His UA however was benign. On exam he does not have any suprapubic tenderness. He was empirically started on ciprofloxacin, this will be continued for 5 more days. Urine culture is pending at the time of discharge, his primary care practitioner will need to follow-up on these results.    Nausea with vomiting: Resolved with supportive care. 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    Elevated total bilirubin level: This is a chronic issue, and suspect not related to his resuscitation for this hospitalization. He is  had extensive workup for this in November 2015. Patient was supposed to follow-up with Dr. Ardis Hughs from [facility] gastroenterology, and seems he was lost to follow-up. I have contacted [facility] gastroenterology, triage nurse will contact patient for a follow-up appointment. Patient has been made aware of this.  TODAY-DAY OF DISCHARGE:  Subjective:   Shaun Hays today has no headache,no chest abdominal pain,no new weakness tingling or numbness, feels much better wants to go home today.  Objective:   Blood pressure 141/97, pulse 64, temperature 98.7 F (37.1 C), temperature source Oral, resp. rate 17, height 5\' 3"  (1.6 m), weight 58.174 kg (128 lb 4 oz), SpO2 100 %.  Intake/Output Summary (Last 24 hours) at 06/21/14 1020 Last  data filed at 06/21/14 0900  Gross per 24 hour  Intake    976 ml  Output   1155 ml  Net   -179 ml   Filed Weights   06/21/14 0324  Weight: 58.174 kg (128 lb 4 oz)    Exam Awake Alert, Oriented *3, No new F.N deficits, Normal affect [place].AT,PERRAL Supple Neck,No JVD, No cervical lymphadenopathy appriciated.  Symmetrical Chest wall movement, Good air movement bilaterally, CTAB RRR,No Gallops,Rubs or new Murmurs, No Parasternal Heave +ve B.Sounds, Abd Soft, Non tender, No organomegaly appriciated, No rebound -guarding or rigidity. No Cyanosis, Clubbing or edema, No new Rash or bruise  DISCHARGE CONDITION: Stable  DISPOSITION: Home  DISCHARGE INSTRUCTIONS:    Activity:  As tolerated  Diet recommendation: Regular Diet  Discharge Instructions    Call MD for:  persistant nausea and vomiting    Complete by:  As directed      Diet general    Complete by:  As directed      Increase activity slowly    Complete by:  As directed            Follow-up Information    Follow up with [facility]    . Call in 1 day.   Contact information:   [address] [place] [address] [phone]      Follow up with Milus Banister, MD.   Specialty:  Gastroenterology   Why:  Office will call you for an appointment.   Contact information:   [address] [address] [phone]       Total Time spent on discharge equals 45 minutes.  SignedOren Binet 06/21/2014 10:20 AM

## 2014-06-21 NOTE — Progress Notes (Addendum)
D/c instruction reviewed with pt, copy of instructions and scripts given to pt. Instructed to complete all antibiotics, encouraged taking po but slowly. Pt declined wheelchair, pt with steady gait, ambulated out with belonging, ride at front entrance A to pick him up.

## 2014-06-21 NOTE — H&P (Addendum)
Hospitalist Admission History and Physical  Patient name: Shaun Hays record number: [id number] Date of birth: 1992/01/08Age: 24 y.o.Gender: male  Primary Care Provider: Pcp Not In System  Chief Complaint: abd pain, UTI   History of Present Illness:This is a 24 y.o. year old male with significant past medical history of RUQ pain, hyperbilirubinemia presenting with abd pain, UTI. Patient reports epigastric and generalized abdominal pain over the past 2-3 days as well as recurrent nausea or vomiting. Reports having a mild drinking binge at the beginning of the weekend. Has had recurrent abdominal pain, nausea or vomiting since this point. Decreased urine output. Feels that he has been dehydrated. Denies any other illicit drug use. Was drinking hard liquor consistently for 1-2 days. Note admission for abdominal pain back in November of last year. 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 Has not followed up as initially planned per patient. Denies any excessive ETOH use per pt.  Presented to the ER afebrile, hemodynamically stable. Noted white blood cell count 13.7, creatinine 0.99, potassium 3.1. T bili 5.4 (T bili 4.8 from last presentation). Abdominal ultrasound with minimal sludge in the gallbladder otherwise stable findings. CT of abdomen and pelvis shows in the urinary bladder status post catheterization with questionable cystitis. UA concerning for infection. UDS + for MJ. Otherwise within normal limits. Given IV Reglan and Zofran. Patient states he subjectively feels moderately improved. Would like to go home. Discussed overall risks and benefits of going home versus hospital admission. Patient agreeable to going home. Pt is now refusing to go home s/p treatment in ER per EDP Sabra Heck  Assessment and Plan: [name] is a 24 y.o. year old male presenting with abd pain, UTI  Active  Problems:  Abdominal pain  UTI (lower urinary tract infection)   1- Abd Pain  -Likely multifactorial in the setting of UTI and likely alcohol associated gastritis. 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  -f/u in am   FEN/GI: regular diet  PPx: sub q heparin  Dispo: pending further evaluation Code Status: Full Code  Patient Active Problem List   Diagnosis Date Noted  . Hyperbilirubinemia 01/19/2014  . Right upper quadrant pain 01/19/2014  . Elevated bilirubin   . Nausea with vomiting   . RUQ pain    Past Medical History: Past Medical History  Diagnosis Date  . Gastritis   . 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 Hemangioma of liver 01/2014    Past Surgical History: Past Surgical History  Procedure Laterality Date  . No past surgeries    . Esophagogastroduodenoscopy N/A 01/21/2014    Procedure: ESOPHAGOGASTRODUODENOSCOPY (EGD); Surgeon: Milus Banister, MD; Location: [facility]; Service: Endoscopy; Laterality: N/A;    Social History: History   Social History  . Marital Status: Single    Spouse Name: N/A  . Number of Children: N/A  . Years of Education: N/A   Social History Main Topics  . Smoking status: Former Smoker -- 5 years    Types: Cigars    Quit date: 01/14/2014  . Smokeless tobacco: Never Used  .  Alcohol Use: Yes     Comment: 01/19/2014 "2-3 mixed drinks maybe 3 times/month"  . Drug Use: Yes    Special: Marijuana     Comment: 01/19/2014 "couple times/wk"  . Sexual Activity: Yes   Other Topics  Concern  . None   Social History Narrative    Family History: History reviewed. No pertinent family history.  Allergies: Allergies  Allergen Reactions  . Diphenhydramine Swelling    Current Facility-Administered Medications  Medication Dose Route Frequency Provider Last Rate Last Dose  . sodium chloride 0.9 % bolus 1,000 mL 1,000 mL Intravenous Once Jamse Mead, PA-C     Current Outpatient Prescriptions  Medication Sig Dispense Refill  . Fructose-Dextrose-Phosphor Acd ([employer] NAUSEA RELIEF PO) Take 1 tablet by mouth daily as needed (nausea, abdominal pain).    . PEDIALYTE (PEDIALYTE) SOLN Take 120 mLs by mouth every 6 (six) hours as needed (upset stomach).     Review Of Systems: 12 point ROS negative except as noted above in HPI.  Physical Exam: Filed Vitals:   06/20/14 2122  BP: 160/92  Pulse: 82  Temp:   Resp:     General: alert and cooperative HEENT: PERRLA and extra ocular movement intact Heart: S1, S2 normal, no murmur, rub or gallop, regular rate and rhythm Lungs: clear to auscultation, no wheezes or rales and unlabored breathing Abdomen: abdomen is soft without significant tenderness, masses, organomegaly or guarding Extremities: extremities normal, atraumatic, no cyanosis or edema Skin:no rashes Neurology: normal without focal findings  Labs and Imaging:  Labs (Brief)    Lab Results  Component Value Date/Time   NA 136 06/20/2014 01:25 PM   K 3.1* 06/20/2014 01:25 PM   CL 94* 06/20/2014 01:25 PM   CO2 27 06/20/2014 01:25 PM   BUN 17 06/20/2014 01:25 PM   CREATININE 0.99 06/20/2014 01:25 PM   GLUCOSE 112* 06/20/2014 01:25 PM      Recent Labs    Lab Results  Component Value Date   WBC 13.7* 06/20/2014   HGB 16.5 06/20/2014   HCT 47.8 06/20/2014   MCV 91.2 06/20/2014   PLT 256 06/20/2014     Urinalysis  Labs (Brief)        Component Value Date/Time   COLORURINE ORANGE* 06/20/2014 1750   APPEARANCEUR CLEAR 06/20/2014 1750   LABSPEC 1.042* 06/20/2014 1750   PHURINE 6.0 06/20/2014 1750   GLUCOSEU NEGATIVE 06/20/2014 1750   HGBUR NEGATIVE 06/20/2014 1750   BILIRUBINUR SMALL* 06/20/2014 1750   KETONESUR >80* 06/20/2014 1750   PROTEINUR 100* 06/20/2014 1750   UROBILINOGEN 1.0 06/20/2014 1750   NITRITE NEGATIVE 06/20/2014 1750   LEUKOCYTESUR TRACE* 06/20/2014 1750         Imaging Results (Last 48 hours)    US Abdomen Complete  06/20/2014 CLINICAL DATA: Abdominal pain and emesis for 3 days. EXAM: ULTRASOUND ABDOMEN COMPLETE COMPARISON: 01/19/2014 FINDINGS: Gallbladder: Physiologically distended. Minimal sludge in the gallbladder lumen. No gallstones or wall thickening visualized. No sonographic Murphy sign noted. Common bile duct: Diameter: 4.6 mm. Liver: No focal lesion identified. Within normal limits in parenchymal echogenicity. IVC: No abnormality visualized. Pancreas: Visualized portion unremarkable. Spleen: Size and appearance within normal limits. Right Kidney: Length: 11.6 cm. Echogenicity within normal limits. No mass or hydronephrosis visualized. Left Kidney: Length: 10.3 cm. Echogenicity within normal limits. No mass or hydronephrosis visualized. Abdominal aorta: No aneurysm visualized. Other findings: None. No ascites. IMPRESSION: 1. Minimal sludge in the gallbladder. No findings of cholecystitis. 2. Otherwise normal abdominal ultrasound. Electronically Signed By:  Jeb Levering M.D. On: 06/20/2014 20:19   Ct Abdomen Pelvis W Contrast  06/20/2014 CLINICAL DATA: Abdominal pain and emesis for 3 days. Fever. EXAM: CT ABDOMEN AND PELVIS WITH CONTRAST TECHNIQUE: Multidetector CT imaging of the abdomen and pelvis was performed using the standard protocol following bolus administration of intravenous contrast. CONTRAST: 23mL  OMNIPAQUE IOHEXOL 300 MG/ML SOLN, 174mL OMNIPAQUE IOHEXOL 300 MG/ML SOLN COMPARISON: Abdominal ultrasound earlier this day. FINDINGS: The included lung bases are clear. The liver, gallbladder, spleen, pancreas, adrenal glands, and kidneys are normal. There is symmetric renal enhancement without hydronephrosis. Stomach is physiologically distended. There are no dilated or thickened bowel loops. The appendix is filled with air in contrast and is normal. Small volume of colonic stool, no colonic wall thickening. No free air, free fluid, or intra-abdominal fluid collection. Abdominal aorta is normal in caliber. No retroperitoneal adenopathy. Within the pelvis the urinary bladder is physiologically distended. There is a moderate volume of intravesicular air, no bladder wall thickening or perivesicular inflammatory change. Prostate gland is normal in size. There is no pelvic free fluid. No pelvic adenopathy. There are no acute or suspicious osseous abnormalities. IMPRESSION: 1. Air in the urinary bladder. This may be related to recent catheterization. Cystitis with gas-forming organism is not excluded, however there is no bladder wall thickening or perivesicular inflammatory change. 2. Otherwise no acute abnormality in the abdomen/pelvis. Electronically Signed By: Jeb Levering M.D. On: 06/20/2014 21:16            Shanda Howells MD  Pager: [phone]

## 2014-06-21 NOTE — Care Management Note (Unsigned)
    Page 1 of 1   06/21/2014     11:55:13 AM CARE MANAGEMENT NOTE 06/21/2014  Patient:  Shaun Hays, Shaun Hays   Account Number:  [id number]  Date Initiated:  06/21/2014  Documentation initiated by:  Carles Collet  Subjective/Objective Assessment:   elevated LFTs, from home self care     Action/Plan:   will follow for any further needs   Anticipated DC Date:  06/21/2014   Anticipated DC Plan:  [facility]      Choice offered to / List presented to:             Status of service:  Completed, signed off Medicare Important Message given?   (If response is "NO", the following Medicare IM given date fields will be blank) Date Medicare IM given:   Medicare IM given by:   Date Additional Medicare IM given:   Additional Medicare IM given by:    Discharge Disposition:    Per UR Regulation:    If discussed at Long Length of Stay Meetings, dates discussed:    Comments:  06-21-14 10:30 Carles Collet RN BSN CM Pt appointment for 4-14 at 11:30 at [facility]. Pt given brochure and explained [place] and pharmacy, Answered questions.

## 2014-06-21 NOTE — Progress Notes (Signed)
UR completed 

## 2014-06-22 LAB — URINE CULTURE
CULTURE: NO GROWTH
Colony Count: NO GROWTH

## 2014-06-22 NOTE — Telephone Encounter (Signed)
I am happy to see him at next available ROV for me. Do not see need of squeezing him in sooner, please ask that the hospitalist read my 01/2014 consult note that clearly addresses the elevated bilirubin issue.

## 2014-06-22 NOTE — Telephone Encounter (Signed)
Dr. Ardis Hughs do you want this pt worked in some place on your schedule? Please advise.

## 2014-06-23 ENCOUNTER — Inpatient Hospital Stay: Payer: BLUE CROSS/BLUE SHIELD | Admitting: Family Medicine

## 2014-06-23 NOTE — Telephone Encounter (Signed)
Left message on machine to call back  

## 2014-06-24 NOTE — Telephone Encounter (Signed)
I called the number listed and was told the number was not the pt's number.  I will send a letter to the pt asking for a call to set up next available appt.

## 2015-09-29 IMAGING — CT CT ABD-PELV W/ CM
2 of 4 series · 15 of 46 positions shown, 17 images · IV contrast (Omni 300)
Comparison: Abdominal ultrasound earlier this day.

CLINICAL DATA: Abdominal pain and emesis for 3 days.  Fever.

EXAM:
CT ABDOMEN AND PELVIS WITH CONTRAST
TECHNIQUE: Multidetector CT imaging of the abdomen and pelvis was performed
using the standard protocol following bolus administration of
intravenous contrast.
CONTRAST:  25mL OMNIPAQUE IOHEXOL 300 MG/ML SOLN, 100mL OMNIPAQUE
IOHEXOL 300 MG/ML SOLN

[Series 2: abd/ pelvis 5.0 i30f 1 · axial · 0.66mm/px · z∈[-417,-47]mm · 12 of 82 slices shown, 14 images]
[im 4/82  soft-tissue]
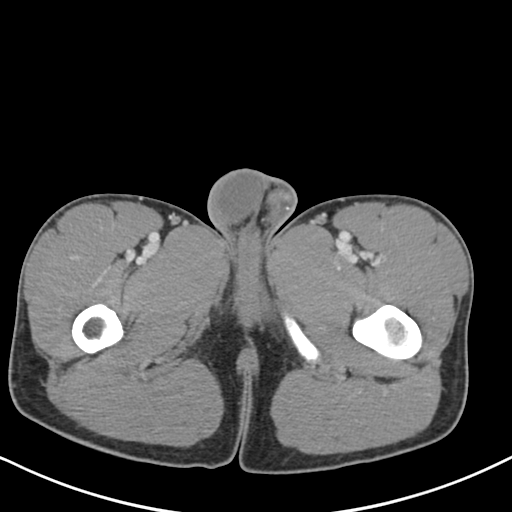
[im 4/82  bone]
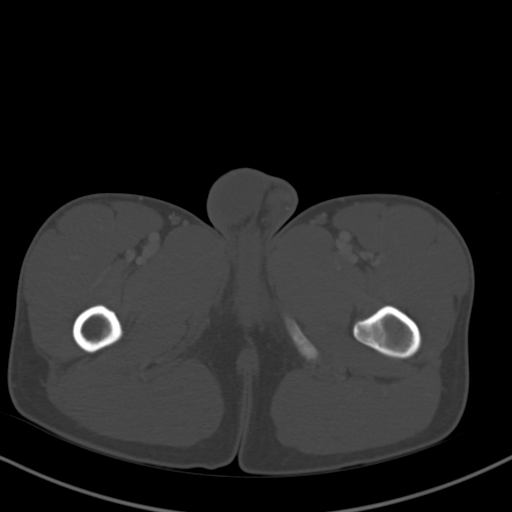
[im 11/82  soft-tissue]
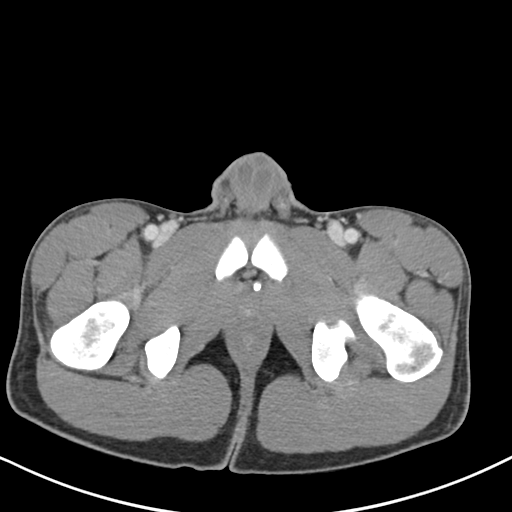
[im 17/82  soft-tissue]
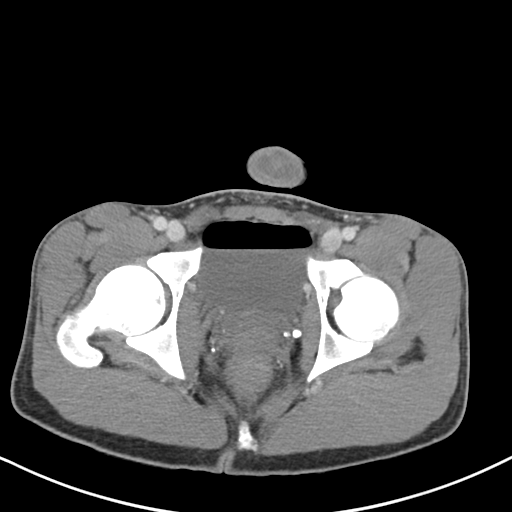
[im 24/82  soft-tissue]
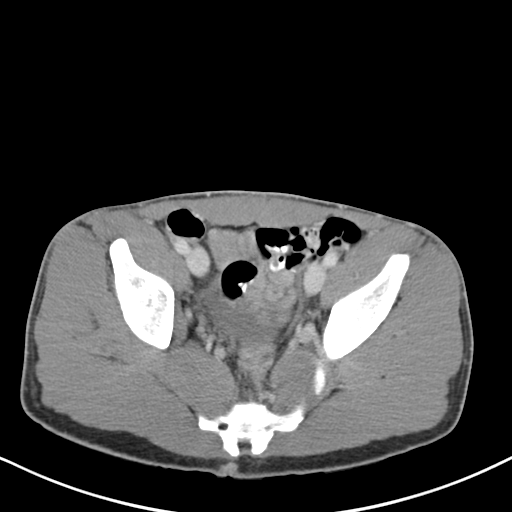
[im 31/82  soft-tissue]
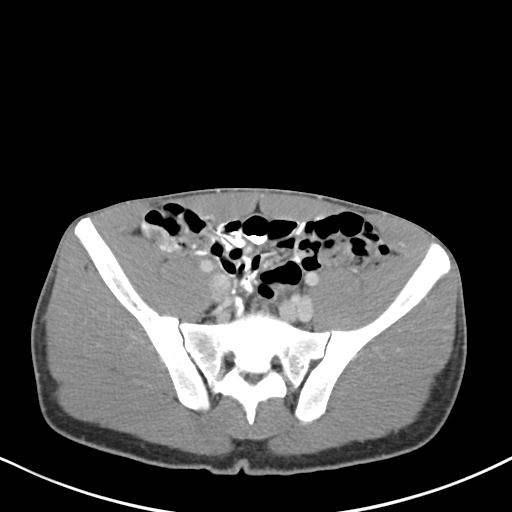
[im 38/82  soft-tissue]
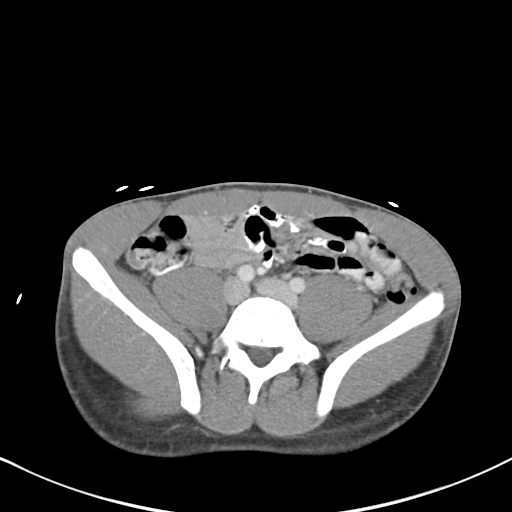
[im 44/82  soft-tissue]
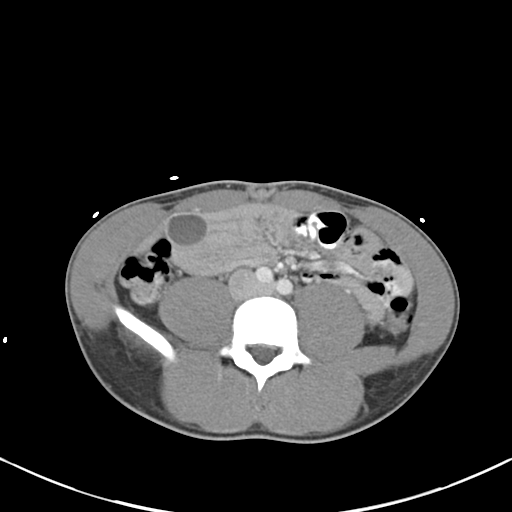
[im 51/82  soft-tissue]
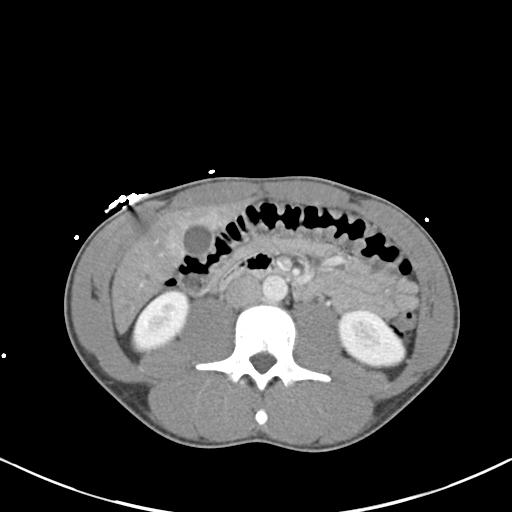
[im 58/82  soft-tissue]
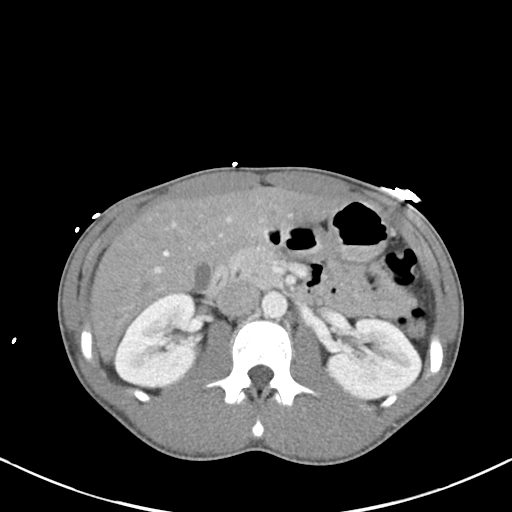
[im 58/82  bone]
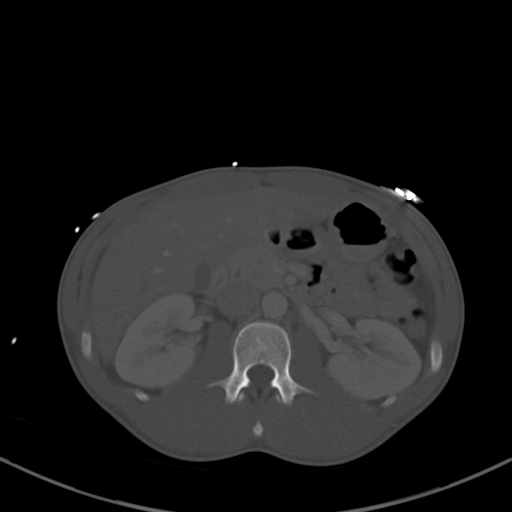
[im 65/82  soft-tissue]
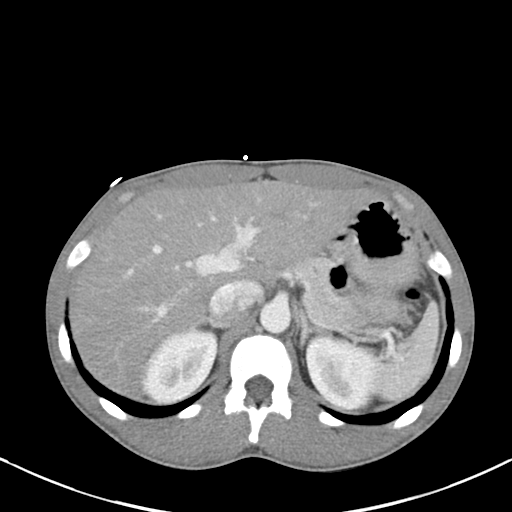
[im 71/82  soft-tissue]
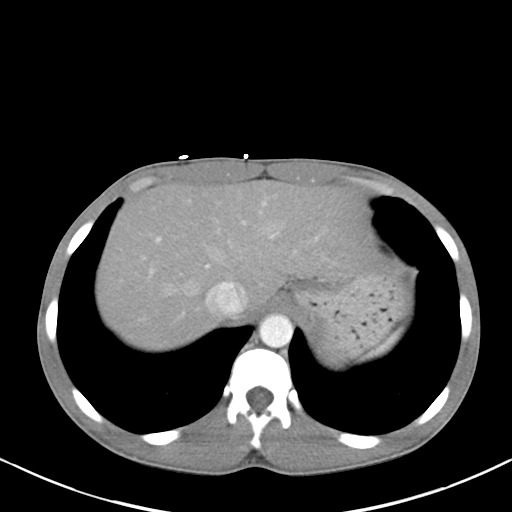
[im 78/82  soft-tissue]
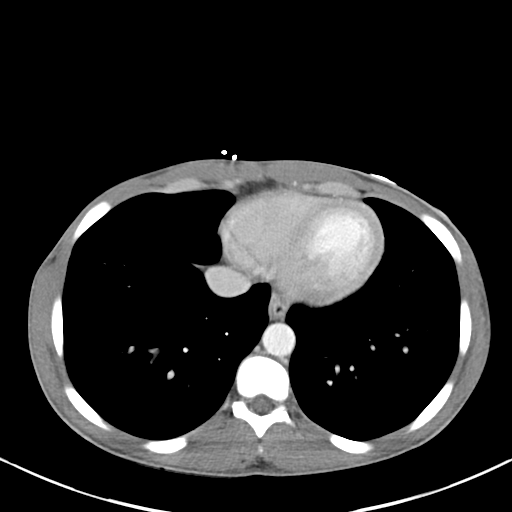

[Series 5: coronals · coronal · 0.55mm/px · 3 of 109 slices shown]
[im 37/109  soft-tissue]
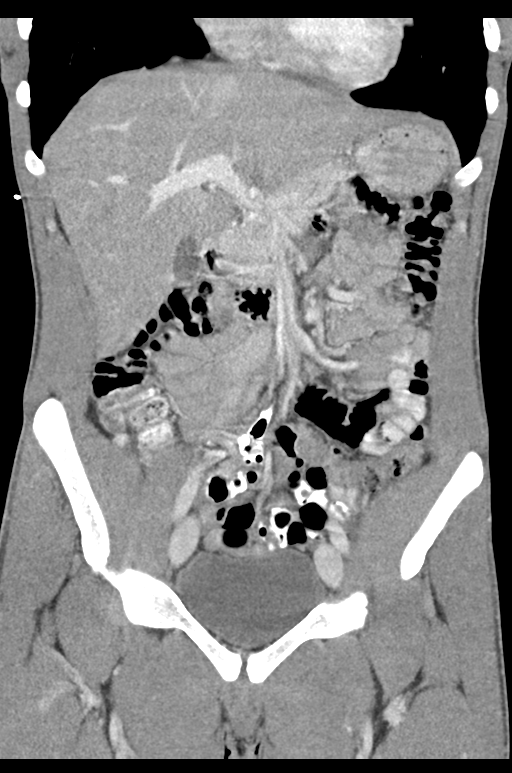
[im 49/109  soft-tissue]
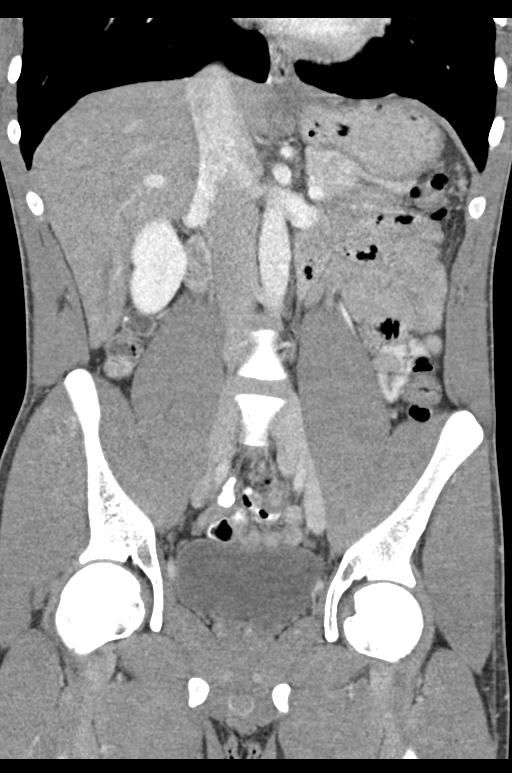
[im 61/109  soft-tissue]
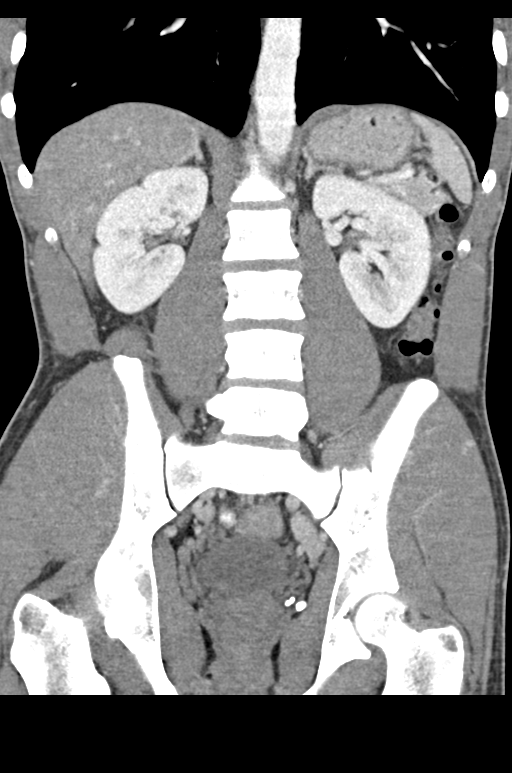

[15 of 46 positions shown; findings below may reference images not displayed]

FINDINGS: The included lung bases are clear.

The liver, gallbladder, spleen, pancreas, adrenal glands, and
kidneys are normal. There is symmetric renal enhancement without
hydronephrosis.

Stomach is physiologically distended. There are no dilated or
thickened bowel loops. The appendix is filled with air in contrast
and is normal. Small volume of colonic stool, no colonic wall
thickening. No free air, free fluid, or intra-abdominal fluid
collection. Abdominal aorta is normal in caliber. No retroperitoneal
adenopathy.

Within the pelvis the urinary bladder is physiologically distended.
There is a moderate volume of intravesicular air, no bladder wall
thickening or perivesicular inflammatory change. Prostate gland is
normal in size. There is no pelvic free fluid. No pelvic adenopathy.

There are no acute or suspicious osseous abnormalities.
IMPRESSION: 1. Air in the urinary bladder. This may be related to recent
catheterization. Cystitis with gas-forming organism is not excluded,
however there is no bladder wall thickening or perivesicular
inflammatory change.
2. Otherwise no acute abnormality in the abdomen/pelvis.
